# Patient Record
Sex: Male | Born: 2000
Health system: Southern US, Community
[De-identification: ages and names within clinical notes are randomized; demographics above are authoritative.]

## PROBLEM LIST (undated history)

## (undated) DIAGNOSIS — R4586 Emotional lability: Secondary | ICD-10-CM

## (undated) DIAGNOSIS — J45909 Unspecified asthma, uncomplicated: Secondary | ICD-10-CM

## (undated) DIAGNOSIS — D369 Benign neoplasm, unspecified site: Secondary | ICD-10-CM

## (undated) DIAGNOSIS — G40909 Epilepsy, unspecified, not intractable, without status epilepticus: Secondary | ICD-10-CM

## (undated) HISTORY — PX: TONSILLECTOMY AND ADENOIDECTOMY: SHX28

## (undated) HISTORY — DX: Unspecified asthma, uncomplicated: J45.909

## (undated) HISTORY — PX: BRAIN SURGERY: SHX531

---
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
## 2019-03-14 ENCOUNTER — Other Ambulatory Visit: Payer: Self-pay

## 2019-03-14 DIAGNOSIS — Z20822 Contact with and (suspected) exposure to covid-19: Secondary | ICD-10-CM

## 2019-03-16 LAB — NOVEL CORONAVIRUS, NAA: SARS-CoV-2, NAA: NOT DETECTED

## 2019-07-19 ENCOUNTER — Ambulatory Visit: Payer: No Typology Code available for payment source | Attending: Internal Medicine

## 2019-07-19 DIAGNOSIS — Z23 Encounter for immunization: Secondary | ICD-10-CM

## 2019-07-19 NOTE — Progress Notes (Signed)
   Covid-19 Vaccination Clinic  Name:  Connor Woods    MRN: [id number] DOB: 2000-08-16  07/19/2019  Mr. Gatson was observed post Covid-19 immunization for 15 minutes without incident. He was provided with Vaccine Information Sheet and instruction to access the V-Safe system.   Mr. Farner was instructed to call 911 with any severe reactions post vaccine: Marland Kitchen Difficulty breathing  . Swelling of face and throat  . A fast heartbeat  . A bad rash all over body  . Dizziness and weakness   Immunizations Administered    Name Date Dose VIS Date Route   Pfizer COVID-19 Vaccine 07/19/2019  9:52 AM 0.3 mL 04/13/2019 Intramuscular   Manufacturer: [place]   Lot: [id number]   [place]: [id number]

## 2019-08-13 ENCOUNTER — Ambulatory Visit: Payer: No Typology Code available for payment source | Attending: Internal Medicine

## 2019-08-13 DIAGNOSIS — Z23 Encounter for immunization: Secondary | ICD-10-CM

## 2019-08-13 NOTE — Progress Notes (Signed)
   Covid-19 Vaccination Clinic  Name:  Connor Woods    MRN: [id number] DOB: 2000/10/18  08/13/2019  Mr. Connor Woods was observed post Covid-19 immunization for 15 minutes without incident. He was provided with Vaccine Information Sheet and instruction to access the V-Safe system.   Mr. Connor Woods was instructed to call 911 with any severe reactions post vaccine: Marland Kitchen Difficulty breathing  . Swelling of face and throat  . A fast heartbeat  . A bad rash all over body  . Dizziness and weakness   Immunizations Administered    Name Date Dose VIS Date Route   Pfizer COVID-19 Vaccine 08/13/2019  9:50 AM 0.3 mL 04/13/2019 Intramuscular   Manufacturer: [place]   Lot: [id number]   [place]: [id number]

## 2019-10-05 ENCOUNTER — Emergency Department (HOSPITAL_COMMUNITY): Payer: 59

## 2019-10-05 ENCOUNTER — Other Ambulatory Visit: Payer: Self-pay

## 2019-10-05 ENCOUNTER — Encounter (HOSPITAL_COMMUNITY): Payer: Self-pay

## 2019-10-05 ENCOUNTER — Emergency Department (HOSPITAL_COMMUNITY)
Admission: EM | Admit: 2019-10-05 | Discharge: 2019-10-05 | Disposition: A | Discharge status: Home / Self Care | Payer: 59 | Attending: Emergency Medicine | Admitting: Emergency Medicine

## 2019-10-05 DIAGNOSIS — D696 Thrombocytopenia, unspecified: Secondary | ICD-10-CM

## 2019-10-05 DIAGNOSIS — Z20822 Contact with and (suspected) exposure to covid-19: Secondary | ICD-10-CM | POA: Diagnosis not present

## 2019-10-05 DIAGNOSIS — R509 Fever, unspecified: Secondary | ICD-10-CM | POA: Diagnosis present

## 2019-10-05 DIAGNOSIS — Z8669 Personal history of other diseases of the nervous system and sense organs: Secondary | ICD-10-CM | POA: Insufficient documentation

## 2019-10-05 DIAGNOSIS — B349 Viral infection, unspecified: Secondary | ICD-10-CM | POA: Diagnosis not present

## 2019-10-05 DIAGNOSIS — R112 Nausea with vomiting, unspecified: Secondary | ICD-10-CM | POA: Diagnosis not present

## 2019-10-05 DIAGNOSIS — Z79899 Other long term (current) drug therapy: Secondary | ICD-10-CM | POA: Diagnosis not present

## 2019-10-05 DIAGNOSIS — J029 Acute pharyngitis, unspecified: Secondary | ICD-10-CM | POA: Diagnosis not present

## 2019-10-05 LAB — CBC WITH DIFFERENTIAL/PLATELET
Abs Immature Granulocytes: 0.07 10*3/uL (ref 0.00–0.07)
Basophils Absolute: 0 10*3/uL (ref 0.0–0.1)
Basophils Relative: 0 %
Eosinophils Absolute: 0 10*3/uL (ref 0.0–0.5)
Eosinophils Relative: 0 %
HCT: 41.9 % (ref 39.0–52.0)
Hemoglobin: 14.9 g/dL (ref 13.0–17.0)
Immature Granulocytes: 1 %
Lymphocytes Relative: 8 %
Lymphs Abs: 0.6 10*3/uL — ABNORMAL LOW (ref 0.7–4.0)
MCH: 33 pg (ref 26.0–34.0)
MCHC: 35.6 g/dL (ref 30.0–36.0)
MCV: 92.7 fL (ref 80.0–100.0)
Monocytes Absolute: 1.6 10*3/uL — ABNORMAL HIGH (ref 0.1–1.0)
Monocytes Relative: 21 %
Neutro Abs: 5.2 10*3/uL (ref 1.7–7.7)
Neutrophils Relative %: 70 %
Platelets: 67 10*3/uL — ABNORMAL LOW (ref 150–400)
RBC: 4.52 MIL/uL (ref 4.22–5.81)
RDW: 11.5 % (ref 11.5–15.5)
WBC: 7.5 10*3/uL (ref 4.0–10.5)
nRBC: 0 % (ref 0.0–0.2)

## 2019-10-05 LAB — COMPREHENSIVE METABOLIC PANEL
ALT: 18 U/L (ref 0–44)
AST: 24 U/L (ref 15–41)
Albumin: 3.4 g/dL — ABNORMAL LOW (ref 3.5–5.0)
Alkaline Phosphatase: 48 U/L (ref 38–126)
Anion gap: 11 (ref 5–15)
BUN: 15 mg/dL (ref 6–20)
CO2: 20 mmol/L — ABNORMAL LOW (ref 22–32)
Calcium: 9.4 mg/dL (ref 8.9–10.3)
Chloride: 108 mmol/L (ref 98–111)
Creatinine, Ser: 1.19 mg/dL (ref 0.61–1.24)
GFR calc Af Amer: >60 mL/min (ref >60)
GFR calc non Af Amer: >60 mL/min (ref >60)
Glucose, Bld: 106 mg/dL — ABNORMAL HIGH (ref 70–99)
Potassium: 3.4 mmol/L — ABNORMAL LOW (ref 3.5–5.1)
Sodium: 139 mmol/L (ref 135–145)
Total Bilirubin: 1.4 mg/dL — ABNORMAL HIGH (ref 0.3–1.2)
Total Protein: 6.2 g/dL — ABNORMAL LOW (ref 6.5–8.1)

## 2019-10-05 LAB — SARS CORONAVIRUS 2 BY RT PCR (HOSPITAL ORDER, PERFORMED IN ~~LOC~~ HOSPITAL LAB): SARS Coronavirus 2: NEGATIVE

## 2019-10-05 MED ORDER — ONDANSETRON HCL 4 MG/2ML IJ SOLN
4.0000 mg | Freq: Once | INTRAMUSCULAR | Status: AC
  Administered 2019-10-05: 4 mg via INTRAVENOUS
  Filled 2019-10-05: qty 2

## 2019-10-05 MED ORDER — ONDANSETRON HCL 4 MG PO TABS: 4.0000 mg | ORAL_TABLET | Freq: Three times a day (TID) | ORAL | 0 refills | Status: DC | PRN

## 2019-10-05 MED ORDER — SODIUM CHLORIDE 0.9 % IV BOLUS
1000.0000 mL | Freq: Once | INTRAVENOUS | Status: AC
  Administered 2019-10-05: 1000 mL via INTRAVENOUS

## 2019-10-05 NOTE — ED Triage Notes (Signed)
Pt presents w/non productive cough, fever, N/V and feeling like crap x1 week. Pt seen at [facility] yesterday prescribed a cough syrup and amoxicillin w/no relief

## 2019-10-05 NOTE — Discharge Instructions (Addendum)
You were seen in the emergency department for continued fever cough sore throat body aches nausea vomiting diarrhea.  You had blood work that showed your platelets were low.  This possibly is a response to your illness.  You will need to have repeat blood work done in a week to make sure that the blood count is stabilizing.  Your chest x-ray and Covid testing were unremarkable.  Please drink plenty of fluids.  We are prescribing some nausea medication to help with your symptoms.  Return to the emergency department if any worsening or concerning symptoms

## 2019-10-05 NOTE — ED Notes (Signed)
Pt discharge instructions and prescription reviewed with the patient. The patient verbalized understanding of instructions. Pt discharged.

## 2019-10-05 NOTE — ED Provider Notes (Signed)
[facility] EMERGENCY DEPARTMENT Provider Note   CSN: [id number] Arrival date & time: 10/05/19  0801     History Chief Complaint  Patient presents with  . Fever  . Cough    JAVID KEMLER is a 19 y.o. male.  He has a history of epilepsy.  Complaining of 1 week of fevers cough nonproductive, sore throat, nausea and vomiting.  Girlfriend had a cold.  Went to urgent care yesterday, strep and mono negative.  Treated with cough syrup and amoxicillin.  No relief.  The history is provided by the patient.  URI Presenting symptoms: cough, fever and sore throat   Severity:  Moderate Onset quality:  Gradual Duration:  1 week Timing:  Constant Progression:  Unchanged Chronicity:  New Relieved by:  Nothing Worsened by:  Nothing Ineffective treatments:  OTC medications and prescription medications Associated symptoms: myalgias   Associated symptoms: no arthralgias, no headaches and no wheezing   Risk factors: sick contacts   Risk factors: no immunosuppression        Past Medical History:  Diagnosis Date  . 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 Epilepsy ([place])    frontal lobe - last seizure 08/20/2013  . Mood swings     Patient Active Problem List   Diagnosis Date Noted  . 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 Psychological factors affecting medical condition 03/19/2013  . 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 CLOSED REDUCTION ELBOW FRACTURE Left 02/14/2010   ulnar fx. and radial head dislocation  . TONSILLECTOMY AND ADENOIDECTOMY    . UMBILICAL HERNIA REPAIR  12/26/2008       No family history on file.  Social History   Tobacco Use  . Smoking status: Never Smoker  . Smokeless tobacco: Never Used  Substance Use Topics  . Alcohol use: No  . Drug use: No    Home Medications Prior to Admission medications   Medication Sig Start Date End Date Taking? Authorizing  Provider  clobazam (ONFI) 10 MG tablet Take 20 mg by mouth every evening.  03/23/13   Winson, Manus Rudd, NP  divalproex (DEPAKOTE ER) 250 MG 24 hr tablet Take 250 mg by mouth every evening. 08/22/13   [provider]  divalproex (DEPAKOTE ER) 500 MG 24 hr tablet Take 500 mg by mouth 2 (two) times daily. 08/22/13   [provider]  guanFACINE (TENEX) 1 MG tablet Take 1 mg by mouth at bedtime.    [provider]  MIDAZOLAM HCL PO Place 1 mL into both nostrils See admin instructions. 1 ml in each nostril for seizure >53min 08/17/11   [provider]  Oxcarbazepine (TRILEPTAL) 300 MG tablet Take 450-600 mg by mouth 2 (two) times daily. Takes 450 mg AM and 600 mg PM    [provider]    Allergies    Patient has no known allergies.  Review of Systems   Review of Systems  Constitutional: Positive for fever.  HENT: Positive for sore throat.   Eyes: Negative for visual disturbance.  Respiratory: Positive for cough. Negative for wheezing.   Cardiovascular: Negative for chest pain.  Gastrointestinal: Positive for nausea and vomiting. Negative for diarrhea.  Genitourinary: Negative for dysuria.  Musculoskeletal: Positive for myalgias. Negative for arthralgias.  Skin: Negative for rash.  Neurological: Negative for headaches.    Physical Exam Updated Vital Signs BP (!) 136/102   Pulse Marland Kitchen)  124   Temp 99.2 F (37.3 C) (Oral)   Resp 18   Ht 6' (1.829 m)   Wt 71.7 kg   SpO2 95%   BMI 21.43 kg/m   Physical Exam Vitals and nursing note reviewed.  Constitutional:      Appearance: He is well-developed.  HENT:     Head: Normocephalic and atraumatic.     Right Ear: Tympanic membrane normal.     Left Ear: Tympanic membrane normal.     Nose: Nose normal.     Mouth/Throat:     Mouth: Mucous membranes are moist.     Pharynx: Oropharynx is clear. Posterior oropharyngeal erythema present. No oropharyngeal exudate.  Eyes:     Conjunctiva/sclera: Conjunctivae  normal.  Cardiovascular:     Rate and Rhythm: Regular rhythm. Tachycardia present.     Heart sounds: No murmur.  Pulmonary:     Effort: Pulmonary effort is normal. No respiratory distress.     Breath sounds: Normal breath sounds.  Abdominal:     Palpations: Abdomen is soft.     Tenderness: There is no abdominal tenderness.  Musculoskeletal:        General: No deformity or signs of injury. Normal range of motion.     Cervical back: Neck supple. No tenderness.  Skin:    General: Skin is warm and dry.     Capillary Refill: Capillary refill takes less than 2 seconds.  Neurological:     General: No focal deficit present.     Mental Status: He is alert.     ED Results / Procedures / Treatments   Labs (all labs ordered are listed, but only abnormal results are displayed) Labs Reviewed  CBC WITH DIFFERENTIAL/PLATELET - Abnormal; Notable for the following components:      Result Value   Platelets 67 (*)    Lymphs Abs 0.6 (*)    Monocytes Absolute 1.6 (*)    All other components within normal limits  COMPREHENSIVE METABOLIC PANEL - Abnormal; Notable for the following components:   Potassium 3.4 (*)    CO2 20 (*)    Glucose, Bld 106 (*)    Total Protein 6.2 (*)    Albumin 3.4 (*)    Total Bilirubin 1.4 (*)    All other components within normal limits  SARS CORONAVIRUS 2 BY RT PCR (HOSPITAL ORDER, [place] [facility])    EKG None  Radiology DG Chest Port 1 View  Result Date: 10/05/2019 CLINICAL DATA:  Cough with fever. EXAM: PORTABLE CHEST 1 VIEW COMPARISON:  09/16/2011 FINDINGS: 0902 hours. Rightward patient rotation. The lungs are clear without focal pneumonia, edema, pneumothorax or pleural effusion. The cardiopericardial silhouette is within normal limits for size. The visualized bony structures of the thorax are intact. IMPRESSION: No active disease. Electronically Signed   By: Misty Stanley M.D.   On: 10/05/2019 09:28    Procedures Procedures (including  critical care time)  Medications Ordered in ED Medications  sodium chloride 0.9 % bolus 1,000 mL (0 mLs Intravenous Stopped 10/05/19 1301)  ondansetron (ZOFRAN) injection 4 mg (4 mg Intravenous Given 10/05/19 0919)    ED Course  I have reviewed the triage vital signs and the nursing notes.  Pertinent labs & imaging results that were available during my care of the patient were reviewed by me and considered in my medical decision making (see chart for details).  Clinical Course as of Oct 04 1925  Fri Oct 05, 2019  0909 Chest x-ray  interpreted by me as no gross infiltrates.  Awaiting radiology reading.   [MB]  1217 Reevaluated patient, tachycardia improved.  He said he is was able to sleep for an hour and feels much better.  Lab work showing normal CBC and hemoglobin but low platelets, change from prior.  Chemistries fairly nonspecific and likely due to some element of dehydration.  Covid testing negative.   [MB]  2563 Discussed with Dr. Irene Limbo hematology who said this is likely due to to suppression from a viral illness.  If he has no objective bleeding and otherwise appears well he can be discharged and would need a repeat blood count within a week.  If his platelets continue to drop would recommend following up with hematology and if he has any bleeding he should return to the emergency department.   [MB]    Clinical Course User Index [MB] Hayden Rasmussen, MD   MDM Rules/Calculators/A&P                     This patient complains of fevers cough nausea vomiting sore throat; this involves an extensive number of treatment Options and is a complaint that carries with it a high risk of complications and Morbidity. The differential includes viral syndrome, Covid, pneumonia, strep throat, mono  I ordered, reviewed and interpreted labs, which included CBC which shows normal white count normal hemoglobin, low potassium changed from prior 6 years ago.  Chemistries fairly nonspecific with a mildly  low potassium bicarb and slightly elevated glucose.  Probably reflecting some element of dehydration.  Covid testing negative. I ordered medication IV fluids and nausea medication with improvement in his symptoms. I ordered imaging studies which included chest x-ray and I independently    visualized and interpreted imaging which showed no gross infiltrates Additional history obtained from patient's mother Previous records obtained and reviewed in epic I consulted Dr Irene Limbo hematology and discussed lab and imaging findings  Critical Interventions: None  After the interventions stated above, I reevaluated the patient and found patient symptoms to be improved.  I reviewed his lab work-up with him and with imaging.  Also reviewed hematology recommendations.  He is comfortable with plan.  Return instructions discussed.  ALVEY BROCKEL was evaluated in Emergency Department on 10/05/2019 for the symptoms described in the history of present illness. He was evaluated in the context of the global COVID-19 pandemic, which necessitated consideration that the patient might be at risk for infection with the SARS-CoV-2 virus that causes COVID-19. Institutional protocols and algorithms that pertain to the evaluation of patients at risk for COVID-19 are in a state of rapid change based on information released by regulatory bodies including the CDC and federal and state organizations. These policies and algorithms were followed during the patient's care in the ED.  Final Clinical Impression(s) / ED Diagnoses Final diagnoses:  Viral illness  Thrombocytopenia ([place])    Rx / DC Orders ED Discharge Orders         Ordered    ondansetron (ZOFRAN) 4 MG tablet  Every 8 hours PRN     10/05/19 1246           Hayden Rasmussen, MD 10/05/19 1930

## 2019-10-24 ENCOUNTER — Ambulatory Visit (INDEPENDENT_AMBULATORY_CARE_PROVIDER_SITE_OTHER): Payer: 59 | Admitting: Neurology

## 2019-10-24 ENCOUNTER — Encounter: Payer: Self-pay | Admitting: Neurology

## 2019-10-24 VITALS — BP 118/76 | HR 102 | Ht 73.0 in | Wt 154.0 lb

## 2019-10-24 DIAGNOSIS — G40802 Other epilepsy, not intractable, without status epilepticus: Secondary | ICD-10-CM | POA: Diagnosis not present

## 2019-10-24 DIAGNOSIS — F54 Psychological and behavioral factors associated with disorders or diseases classified elsewhere: Secondary | ICD-10-CM | POA: Diagnosis not present

## 2019-10-24 MED ORDER — DIVALPROEX SODIUM ER 500 MG PO TB24: 1000.0000 mg | ORAL_TABLET | Freq: Every evening | ORAL | 1 refills | Status: DC

## 2019-10-24 MED ORDER — DIVALPROEX SODIUM ER 250 MG PO TB24: 250.0000 mg | ORAL_TABLET | Freq: Every day | ORAL | 1 refills | Status: DC

## 2019-10-24 MED ORDER — CLOBAZAM 20 MG PO TABS: 20.0000 mg | ORAL_TABLET | Freq: Every evening | ORAL | 3 refills | Status: DC

## 2019-10-24 MED ORDER — OXCARBAZEPINE 600 MG PO TABS: 900.0000 mg | ORAL_TABLET | Freq: Every day | ORAL | 1 refills | Status: DC

## 2019-10-24 MED ORDER — SERTRALINE HCL 100 MG PO TABS: 100.0000 mg | ORAL_TABLET | Freq: Every day | ORAL | 1 refills | Status: DC

## 2019-10-24 NOTE — Patient Instructions (Signed)
Epilepsy Epilepsy is when a person keeps having seizures. A seizure is a burst of abnormal activity in the brain. A seizure can change how you think or behave, and it can make it hard to be aware of what is happening. This condition can cause problems such as:  Falls, accidents, and injury.  Sadness (depression).  Poor memory.  Sudden unexplained death in epilepsy (SUDEP). This is rare. Its cause is not known. Most people with epilepsy lead normal lives. What are the causes? This condition may be caused by:  A head injury.  An injury that happens at birth.  A high fever during childhood.  A stroke.  Bleeding that goes into or around the brain.  Certain medicines and drugs.  Having too little oxygen for a long period of time.  Abnormal brain development.  Certain infections.  Brain tumors.  Conditions that are passed from parent to child (are hereditary). What are the signs or symptoms? Symptoms of a seizure vary from person to person. They may include:  Jerky movements of muscles (convulsions).  Stiffening of the body.  Movements of the arms or legs that you are not able to control.  Passing out (loss of consciousness).  Breathing problems.  Sudden falls.  Confusion.  Head nodding.  Eye blinking or twitching.  Lip smacking.  Drooling.  Fast eye movements.  Grunting.  Not being able to control when you pee or poop.  Staring.  Being hard to wake up (unresponsiveness). Some people have symptoms right before a seizure happens (aura) and right after a seizure happens. These symptoms include:  Fear or anxiety.  Feeling sick to your stomach (nauseous).  Feeling like the room is spinning (vertigo).  A feeling of having seen or heard something before (dj vu).  Odd tastes or smells.  Changes in how you see (vision), such as seeing flashing lights or spots. Symptoms that follow a seizure include:  Being confused.  Being sleepy.  Having a  headache. How is this treated? Treatment can control seizures. Treatment for this condition may involve:  Taking medicines to control seizures.  Having a device (vagus nerve stimulator) put in the chest. The device sends signals to a nerve and to the brain to prevent seizures.  Brain surgery to stop seizures from happening or to reduce how often they happen.  Having blood tests often to make sure you are getting the right amount of medicine. Once this condition has been diagnosed, it is important to start treatment as soon as possible. For some people, epilepsy goes away in time. Others will need treatment for the rest of their life. Follow these instructions at home: Medicines  Take over-the-counter and prescription medicines only as told by your doctor.  Avoid anything that may keep your medicine from working, such as alcohol. Activity  Get enough rest. Lack of sleep can make seizures more likely to occur.  Follow your doctor's advice about driving, swimming, and doing anything else that would be dangerous if you had a seizure. ? If you live in the [place] check with your local DMV (department of motor vehicles) to find out about local driving laws. Each state has rules about when you can return to driving. Teaching others Teach friends and family what to do if you have a seizure. They should:  Lay you on the ground to prevent a fall.  Cushion your head and body.  Loosen any tight clothing around your neck.  Turn you on your side.  Stay with   you until you are better.  Not hold you down.  Not put anything in your mouth.  Know whether or not you need emergency care.  General instructions  Avoid anything that causes you to have seizures.  Keep a seizure diary. Write down what you remember about each seizure. Be sure to include what might have caused it.  Keep all follow-up visits as told by your doctor. This is important. Contact a doctor if:  You have a change in how  often or when you have seizures.  You get an infection or start to feel sick. You may have more seizures when you are sick. Get help right away if:  A seizure does not stop after 5 minutes.  You have more than one seizure in a row, and you do not have enough time between the seizures to feel better.  A seizure makes it harder to breathe.  A seizure is different from other seizures you have had.  A seizure makes you unable to speak or use a part of your body.  You did not wake up right after a seizure. These symptoms may be an emergency. Do not wait to see if the symptoms will go away. Get medical help right away. Call your local emergency services (911 in the [place] Do not drive yourself to the hospital. Summary  Epilepsy is when a person keeps having seizures. A seizure is a burst of abnormal activity in the brain.  Treatment can control seizures.  Teach friends and family what to do if you have a seizure. This information is not intended to replace advice given to you by your health care provider. Make sure you discuss any questions you have with your health care provider. Document Revised: 12/12/2017 Document Reviewed: 12/12/2017 Elsevier Patient Education  2020 Elsevier Inc.  

## 2019-10-24 NOTE — Progress Notes (Signed)
Provider:  Larey Seat, M D  Referring Provider: Monna Fam, MD Primary Care Physician:  Deland Pretty, MD  Chief Complaint  Patient presents with  . New Patient (Initial Visit)    pt with mom, rm 10. pt has had epilepsy and brain surgery 5 yrs ago. Here to establish to care with neurology for seizure management.     HPI:  Connor Woods is a 19 y.o. male patient is seen here on 10-24-2019, as a referral  from Dr. Shelia Media for transfer of epilepsy care. Connor Woods was raised by his Adonis Brook. He was born at 48 weeks , 3  Pounds 5 ounces.  Seizures declared at age 89, [place] mal epilepsy.  Connor Woods underwent Epilepsy surgery at [facility] in 8/ 2016, and has been seizure free since.  He had intracranial monitoring- left temporal focus was identified.   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 5 years ago in August he underwent intracranial electrode placement and within 24 hours a seizure focus was identified within a week he underwent a right-sided temporal lobectomy.  He has since then denied to have any seizure activity, none has been witnessed, he does not have any perceived deficits in his medication regimen has been unchanged since March 2019 and it was last refilled through the [facility] offices.  Under the current guidelines I would not change his medication even if medication has led to some thrombocytopenia.  He is taking valproic acid, clobazam, oxcarbazepine.  He has had extensive blood work with Dr. Shelia Media   Social history: [name], [place], and [place], [place]. Non smoker and non drinker. 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     Review of Systems: Out of a complete 14 system review, the patient complains of only the following symptoms, and all other  reviewed systems are negative.   Just needs refills now.   Social History   Socioeconomic History  . Marital status: Single    Spouse name: Not on file  . Number of children: Not on file  . Years of education: Not on file  . Highest education level: Not on file  Occupational History  . Not on file  Tobacco Use  . Smoking status: Never Smoker  . Smokeless tobacco: Never Used  Substance and Sexual Activity  . Alcohol use: No  . Drug use: No  . Sexual activity: Never  Other Topics Concern  . Not on file  Social History Narrative  . Not on file   Social Determinants of Health   Financial Resource Strain:   . Difficulty of Paying Living Expenses:   Food Insecurity:   . Worried About Charity fundraiser in the Last Year:   . Arboriculturist in the Last Year:   Transportation Needs:   . Film/video editor (Medical):   Marland Kitchen Lack of Transportation (Non-Medical):   Physical Activity:   . Days of Exercise per Week:   . Minutes of Exercise per Session:   Stress:   . Feeling of Stress :   Social Connections:   . Frequency of Communication with Friends and Family:   . Frequency of Social Gatherings with Friends and Family:   . Attends Religious Services:   . Active Member of Clubs or Organizations:   . Attends Archivist Meetings:   .  Marital Status:   Intimate Partner Violence:   . Fear of Current or Ex-Partner:   . Emotionally Abused:   Marland Kitchen Physically Abused:   . Sexually Abused:     No family history on file.  Past Medical History:  Diagnosis Date  . 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 Mood swings     Past Surgical History:  Procedure Laterality Date  . CLOSED REDUCTION ELBOW FRACTURE Left 02/14/2010   ulnar fx. and radial head dislocation  . TONSILLECTOMY AND ADENOIDECTOMY    . UMBILICAL HERNIA REPAIR  12/26/2008    Current Outpatient Medications  Medication Sig Dispense Refill   . cloBAZam (ONFI) 20 MG tablet Take 20 mg by mouth every evening.     . divalproex (DEPAKOTE ER) 250 MG 24 hr tablet Take 250 mg by mouth every evening.    . divalproex (DEPAKOTE ER) 500 MG 24 hr tablet Take 1,000 mg by mouth every evening.     Marland Kitchen oxcarbazepine (TRILEPTAL) 600 MG tablet Take 900 mg by mouth daily.      No current facility-administered medications for this visit.    Allergies as of 10/24/2019  . (No Known Allergies)    Vitals: There were no vitals taken for this visit. Last Weight:  Wt Readings from Last 1 Encounters:  10/05/19 158 lb (71.7 kg) (55 %, Z= 0.13)*   * Growth percentiles are based on CDC (Boys, 2-20 Years) data.   Last Height:   Ht Readings from Last 1 Encounters:  10/05/19 6' (1.829 m) (80 %, Z= 0.86)*   * Growth percentiles are based on CDC (Boys, 2-20 Years) data.    BMI ?  Physical exam:  General: The patient is awake, alert and appears not in acute distress. The patient is well groomed. Head: Normocephalic, atraumatic. Neck is supple.   Cardiovascular:  Regular rate and rhythm, without  murmurs or carotid bruit, and without distended neck veins. Respiratory: Lungs are clear to auscultation. Skin:  Without evidence of edema, or rash Trunk: BMI is  elevated and patient  has normal posture.  Neurologic exam : The patient is awake and alert, oriented to place and time.  Memory subjective described as intact. There is a normal attention span & concentration ability. Speech is fluent without dysarthria, dysphonia or aphasia. Mood and affect are appropriate.  Cranial nerves: Pupils are equal and briskly reactive to light. Funduscopic exam without evidence of pallor or edema. Extraocular movements  in vertical and horizontal planes  With an left to right nystagmus. Ptosis. Large pupils.  Visual fields by finger perimetry are intact. Hearing to finger rub intact.   Facial sensation intact to fine touch. Facial motor strength is symmetric and  tongue and uvula move midline.  Tongue protrusion into either cheek is normal. Shoulder shrug is normal.  Motor exam:  Normal tone ,muscle bulk and symmetric  strength in all extremities. Sensory:  Fine touch, pinprick and vibration were tested in all extremities.  Proprioception was normal. Coordination: Rapid alternating movements in the fingers/hands were normal. Finger-to-nose maneuver without evidence of ataxia, dysmetria or tremor. Gait and station: Patient walks without assistive device and is able unassisted to climb up to the exam table. Strength within normal limits. Stance is stable and normal. Tandem gait is unfragmented.  Deep tendon reflexes: in the  upper and lower extremities are symmetric and intact. Babinski maneuver response is  downgoing.  I was able to review Dr. Pennie Banter recent lab results for the patient on the smart phone.  White blood cell count was 8.8K which is excellent RBC 4.39, hemoglobin 14.3, hematocrit 40.7% platelets 176,000 lymphocytes 28 neutrophils 55 eosinophils 1.3 basophil 0.2M0 immature 15% there is no thrombocytopenia at this level.  Metabolic panel was entirely normal except for a lower slightly low albumin and protein total.  Liver and kidney function intact, creatinine was 0.84 AST 22 ALT 21 lipid panel showed low total cholesterol but high triglycerides which may be more diet induced.  Urine analysis pH is 7.5 no proteinuria no ketones.  Assessment:  After physical and neurologic examination, review of laboratory studies, imaging, neurophysiology testing and pre-existing records, assessment is that of :   Excellent seizure control on current meds, needs refill.   Plan:  Treatment plan and additional workup :  I will refill the medication.  I requested the medical records.    Asencion Partridge Dex Blakely MD 10/24/2019

## 2019-11-08 ENCOUNTER — Telehealth: Payer: Self-pay | Admitting: Neurology

## 2019-11-08 NOTE — Telephone Encounter (Signed)
Called the patient's caregiver and advised that from what I can tell this can be gotten off of [email or web address]. the cheapest I was able to find the medication was [facility] for the 20 mg 90 tablet generic brand-56$. Advised an alternative would be going to onfi.com in which she can call their patient support number to find what would be the better savings route through the website. She was very appreciative for the call and had no questions Pt will reach out in 2 mths to advise a refill will need to be sent and at that point we can reassess who has better price and send to that pharmacy using [facility].

## 2019-11-08 NOTE — Telephone Encounter (Signed)
Pt's mother ([place], [place]) has called asking for a prescription card for pt's cloBAZam (ONFI) 20 MG tablet.  She states she just paid $300.00 for a 3 month supply.  Please call

## 2020-04-14 ENCOUNTER — Encounter: Payer: Self-pay | Admitting: Neurology

## 2020-04-14 ENCOUNTER — Ambulatory Visit (INDEPENDENT_AMBULATORY_CARE_PROVIDER_SITE_OTHER): Payer: 59 | Admitting: Neurology

## 2020-04-14 ENCOUNTER — Other Ambulatory Visit: Payer: Self-pay

## 2020-04-14 VITALS — BP 109/68 | HR 86 | Ht 73.0 in | Wt 140.0 lb

## 2020-04-14 DIAGNOSIS — F54 Psychological and behavioral factors associated with disorders or diseases classified elsewhere: Secondary | ICD-10-CM

## 2020-04-14 DIAGNOSIS — F0630 Mood disorder due to known physiological condition, unspecified: Secondary | ICD-10-CM | POA: Diagnosis not present

## 2020-04-14 DIAGNOSIS — G40802 Other epilepsy, not intractable, without status epilepticus: Secondary | ICD-10-CM | POA: Diagnosis not present

## 2020-04-14 MED ORDER — DIVALPROEX SODIUM ER 250 MG PO TB24: 250.0000 mg | ORAL_TABLET | Freq: Every evening | ORAL | 1 refills | Status: DC

## 2020-04-14 MED ORDER — DIVALPROEX SODIUM ER 500 MG PO TB24: 1000.0000 mg | ORAL_TABLET | Freq: Every evening | ORAL | 1 refills | Status: DC

## 2020-04-14 MED ORDER — OXCARBAZEPINE 600 MG PO TABS: 900.0000 mg | ORAL_TABLET | Freq: Every day | ORAL | 1 refills | Status: DC

## 2020-04-14 MED ORDER — CLOBAZAM 20 MG PO TABS: 20.0000 mg | ORAL_TABLET | Freq: Every evening | ORAL | 3 refills | Status: DC

## 2020-04-14 NOTE — Progress Notes (Signed)
Provider:  Larey Seat, M D  Referring Provider: Deland Pretty, MD Primary Care Physician:  Deland Pretty, MD  Chief Complaint  Patient presents with  . Follow-up    Pt alone, rm 11. Presents for 6 month f/u. Denies any sz like activity. States overall no issues or concerns.     HPI:  Connor Woods is a 19 y.o. 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  No further seizure activity since 6 month ago- time for a refill of depakote, clobazam and oxcarbazepine.  Needs some baseline labs today, but reports that Dr. Shelia Media has just done that - I have no access by [facility] and he didn't bring his smart phone or print out. - CMET, CBC and LFT. Is now fully vaccinated for Covid 19.        10-24-2019, as a referral  from Dr. Shelia Media for transfer of epilepsy care. Connor Woods was raised by his Adonis Brook. He was born at 33 weeks , 3  Pounds 5 ounces.  Seizures declared at age 56, [place] mal epilepsy.  Connor Woods underwent Epilepsy surgery at [facility] in 8/ 2016, and has been seizure free since.  He had intracranial monitoring- left temporal focus was identified.   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 5 years ago in August he underwent intracranial electrode placement and within 24 hours a seizure focus was identified within a week he underwent a right-sided temporal lobectomy.  He has since then denied to have any seizure activity, none has been witnessed, he does not have any perceived deficits in his medication regimen has been unchanged since March 2019 and it was last refilled through the [facility] offices.  Under the current guidelines I would not change his medication even if medication has led to some thrombocytopenia.  He is taking valproic acid, clobazam,  oxcarbazepine.  He has had extensive blood work with Dr. Shelia Media  Social history: [name], [place], and [place], [place]. Non smoker and non drinker. 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     Review of Systems: Out of a complete 14 system review, the patient complains of only the following symptoms, and all other reviewed systems are negative.   Just needs refills now.    Mood reportedly stabile and no acute depression. PHQ2 - negative.   Social History   Socioeconomic History  . Marital status: Single    Spouse name: Not on file  . Number of children: Not on file  . Years of education: Not on file  . Highest education level: Not on file  Occupational History  . Not on file  Tobacco Use  . Smoking status: Never Smoker  . Smokeless tobacco: Never Used  Vaping Use  . Vaping Use: Every day  Substance and Sexual Activity  . Alcohol use: No  . Drug use: No  . Sexual activity: Never  Other Topics Concern  . Not on file  Social History Narrative  . Not on file   Social Determinants of Health   Financial Resource Strain: Not on file  Food Insecurity: Not on file  Transportation Needs: Not on file  Physical Activity: Not on file  Stress: Not on file  Social Connections: Not on file  Intimate Partner Violence: Not on file  Family History  Problem Relation Age of Onset  . Kidney disease Paternal Aunt   . Cancer Paternal Uncle   . Lung cancer Maternal Grandfather   . Hypertension Paternal Grandmother     Past Medical History:  Diagnosis Date  . Asthma   . 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 Mood swings     Past Surgical History:  Procedure Laterality Date  . BRAIN SURGERY    . CLOSED REDUCTION ELBOW FRACTURE Left 02/14/2010   ulnar fx. and radial head dislocation  . TONSILLECTOMY AND ADENOIDECTOMY    . UMBILICAL HERNIA REPAIR  12/26/2008    Current  Outpatient Medications  Medication Sig Dispense Refill  . cloBAZam (ONFI) 20 MG tablet Take 1 tablet (20 mg total) by mouth every evening. 90 tablet 3  . divalproex (DEPAKOTE ER) 250 MG 24 hr tablet Take 250 mg by mouth every evening.    . divalproex (DEPAKOTE ER) 250 MG 24 hr tablet Take 1 tablet (250 mg total) by mouth daily. 90 tablet 1  . divalproex (DEPAKOTE ER) 500 MG 24 hr tablet Take 2 tablets (1,000 mg total) by mouth every evening. 180 tablet 1  . oxcarbazepine (TRILEPTAL) 600 MG tablet Take 1.5 tablets (900 mg total) by mouth daily. 135 tablet 1  . sertraline (ZOLOFT) 100 MG tablet Take 1 tablet (100 mg total) by mouth daily. 90 tablet 1   No current facility-administered medications for this visit.    Allergies as of 04/14/2020  . (No Known Allergies)    Vitals: BP 109/68   Pulse 86   Ht 6\' 1"  (1.854 m)   Wt 140 lb (63.5 kg)   BMI 18.47 kg/m  Last Weight:  Wt Readings from Last 1 Encounters:  04/14/20 140 lb (63.5 kg)   Last Height:   Ht Readings from Last 1 Encounters:  04/14/20 6\' 1"  (1.854 m)    BMI ?  Physical exam:  General: The patient is awake, alert and appears not in acute distress. The patient is well groomed. Head: Normocephalic, atraumatic. Neck is supple.   Cardiovascular:  Regular rate and rhythm, without  murmurs or carotid bruit, and without distended neck veins. Respiratory: Lungs are clear to auscultation. Skin:  Without evidence of edema, or rash Trunk: BMI is  elevated and patient  has normal posture.  Neurologic exam : The patient is awake and alert, oriented to place and time.  Memory subjective described as intact. There is a normal attention span & concentration ability. Speech is fluent without dysarthria, dysphonia or aphasia. Mood and affect are appropriate.  Cranial nerves: Pupils are equal and briskly reactive to light. Funduscopic exam without evidence of pallor or edema. Extraocular movements  in vertical and horizontal planes   With an left to right nystagmus.  Ptosis. Large pupils but round and reactive , consensual .  Visual fields by finger perimetry are intact. Hearing to finger rub intact.   Facial sensation intact to fine touch. Facial motor strength is symmetric and tongue and uvula move midline.  Tongue protrusion into either cheek is normal. Shoulder shrug is normal.  Motor exam:  Normal tone ,muscle bulk and symmetric  strength in all extremities. Sensory:  Fine touch, pinprick and vibration were tested in all extremities.  Proprioception was normal. Coordination: Rapid alternating movements in the fingers/hands were normal. Finger-to-nose maneuver without evidence of ataxia, dysmetria or tremor. Gait and station:  Patient walks without assistive device and is able unassisted to climb up to the exam table. Strength within normal limits. Stance is stable and normal. Tandem gait is unfragmented.  Deep tendon reflexes: in the  upper and lower extremities are symmetric and intact. Babinski maneuver response is  downgoing.   I was able to review Dr. Pennie Banter recent lab results for the patient on the smart phone in June 2021.  White blood cell count was 8.8K which is excellent RBC 4.39, hemoglobin 14.3, hematocrit 40.7% platelets 176,000 lymphocytes 28 neutrophils 55 eosinophils 1.3 basophil 0.2M0 immature 15% there is no thrombocytopenia at this level.   Metabolic panel was entirely normal except for a lower slightly low albumin and protein total.  Liver and kidney function intact, creatinine was 0.84 AST 22 ALT 21 lipid panel showed low total cholesterol but high triglycerides which may be more diet induced.  Urine analysis pH is 7.5 no proteinuria no ketones.  Assessment:  After physical and neurologic examination, review of laboratory studies, imaging, neurophysiology testing and pre-existing records, assessment is that of :   Excellent seizure control on current meds, needs refill.   Plan:  Treatment plan and  additional workup :  I will refill the medication.  I requested the medical records.    Connor Partridge Maclovio Henson MD 04/14/2020

## 2020-04-14 NOTE — Patient Instructions (Signed)
Dysphoria Dysphoria is an emotion in which a person feels unpleasant or uncomfortable. It may involve mood changes and feelings of sadness, anxiety, irritability, and restlessness. Dysphoria is often caused by normal life stress and usually goes away within several days. Dysphoria that lasts longer than several days may be a symptom of a mental illness, such as major depression or bipolar disorder. Follow these instructions at home:     Monitor your mood for any changes. Take these steps to help with discomfort and unpleasant feelings: Lifestyle  Maintain a healthy lifestyle. ? Eat a healthy diet that includes mood boosters such as nuts, fatty fish, fruits, and vegetables. ? Exercise regularly. Aim for about 20 minutes a day of physical activity. ? Get enough sleep. Try to sleep at least 8 hours every night.  Avoid alcohol and drugs.  Learn ways to reduce stress and cope with stress, such as with yoga and meditation.  Make time to do things that you enjoy.  Do not use any products that contain nicotine or tobacco, such as cigarettes and e-cigarettes. If you need help quitting, ask your health care provider. General instructions  Take over-the-counter and prescription medicines only as told by your health care provider.  Check with your health care provider before taking any herbs or supplements.  Talk about your feelings with family members or health care providers.  Keep all follow-up visits as told by your health care provider.This is important. Contact a health care provider if you:  Were given medicine and it does not seem to be helping.  Feel hopeless and overwhelmed.  Feel like you cannot leave your house.  Have trouble taking care of yourself. Get help right away if you:  Have serious thoughts about hurting yourself or others. If you ever feel like you may hurt yourself or others, or have thoughts about taking your own life, get help right away. You can go to your  nearest emergency department or call:  Your local emergency services (911 in the [place]  A suicide crisis helpline, such as the [facility] at 3640113623. This is open 24 hours a day. Summary  Dysphoria is an emotion in which a person feels unpleasant or uncomfortable. It may involve mood changes and feelings of sadness, anxiety, irritability, and restlessness.  Dysphoria that lasts longer than several days may be a symptom of a mental illness, such as major depression or bipolar disorder.  Take steps to boost your mood by eating well and getting enough sleep and exercise. Be sure to see a health care provider if your symptoms worsen or do not go away with treatment. This information is not intended to replace advice given to you by your health care provider. Make sure you discuss any questions you have with your health care provider. Document Revised: 04/01/2017 Document Reviewed: 03/31/2017 Elsevier Patient Education  2020 [employer].

## 2020-04-17 ENCOUNTER — Other Ambulatory Visit: Payer: Self-pay | Admitting: Neurology

## 2020-04-18 ENCOUNTER — Other Ambulatory Visit: Payer: Self-pay | Admitting: Neurology

## 2020-05-17 ENCOUNTER — Other Ambulatory Visit: Payer: Self-pay | Admitting: Neurology

## 2020-10-09 ENCOUNTER — Other Ambulatory Visit: Payer: Self-pay | Admitting: Neurology

## 2020-10-10 ENCOUNTER — Other Ambulatory Visit: Payer: Self-pay | Admitting: Neurology

## 2020-12-18 ENCOUNTER — Other Ambulatory Visit: Payer: Self-pay | Admitting: Neurology

## 2020-12-23 ENCOUNTER — Telehealth: Payer: Self-pay | Admitting: Neurology

## 2020-12-23 ENCOUNTER — Other Ambulatory Visit: Payer: Self-pay | Admitting: Neurology

## 2020-12-23 NOTE — Telephone Encounter (Signed)
I called pt. He has not changed insurance. Still has [facility]. He provided me info from last years card. Mother, Montez Hageman got on the phone and states he did get new card. They will log into his health insurance portal to get new info and call back with this.

## 2020-12-23 NOTE — Telephone Encounter (Signed)
Called [facility] because PA they started on CMM via nctracks/medicaid. They state this was incorrect, to delete. This is secondary insurance. They will start a new PA via [employer] (primary insurance)

## 2020-12-23 NOTE — Telephone Encounter (Signed)
The request has been approved. The authorization is effective from 12/23/2020 to 12/22/2021, as long as the member is enrolled in their current health plan. The request was reviewed and approved by a licensed clinical pharmacist. A written notification letter will follow with additional details.  Called and spoke w/ mother. Advised PA approved and he can now fill rx at pharmacy. She verbalized understanding and appreciation.

## 2020-12-23 NOTE — Telephone Encounter (Signed)
Pt is needing a PA for the cloBAZam (ONFI) 20 MG tablet  Pt only has enough medication to last him till Sat the 27th [facility] on [address] faxed paperwork already Please advise.

## 2020-12-23 NOTE — Telephone Encounter (Signed)
Submitted urgent PA on CMM. Key: Key: [id number] - PA Case ID: [id number]. Waiting on determination from [place].

## 2020-12-23 NOTE — Telephone Encounter (Signed)
Noted, we just received request from pharmacy. We will work on this.

## 2020-12-23 NOTE — Telephone Encounter (Signed)
Pt's mother Thade Sledz (on [place]) called,here is the insurance info:  [place] Member ID: [id number] Group ID: [id number]: [id number]

## 2021-04-01 ENCOUNTER — Other Ambulatory Visit: Payer: Self-pay | Admitting: Neurology

## 2021-04-02 NOTE — Telephone Encounter (Signed)
Received refill request for Onif.  Last OV was on 04/14/20.  Next OV is scheduled for 04/14/21 .  Last RX was written on 12/24/20 for 90 tabs.   [place] [facility] Database has been reviewed.   Please fill as work in [employer].

## 2021-04-07 ENCOUNTER — Other Ambulatory Visit: Payer: Self-pay | Admitting: Neurology

## 2021-04-14 ENCOUNTER — Encounter: Payer: Self-pay | Admitting: Adult Health

## 2021-04-14 ENCOUNTER — Ambulatory Visit: Payer: 59 | Admitting: Adult Health

## 2021-04-20 ENCOUNTER — Other Ambulatory Visit: Payer: Self-pay

## 2021-04-20 ENCOUNTER — Encounter (HOSPITAL_COMMUNITY): Payer: Self-pay | Admitting: Emergency Medicine

## 2021-04-20 ENCOUNTER — Emergency Department (HOSPITAL_COMMUNITY): Payer: 59

## 2021-04-20 ENCOUNTER — Emergency Department (HOSPITAL_COMMUNITY)
Admission: EM | Admit: 2021-04-20 | Discharge: 2021-04-21 | Disposition: A | Discharge status: Home / Self Care | Payer: 59 | Attending: Emergency Medicine | Admitting: Emergency Medicine

## 2021-04-20 DIAGNOSIS — R079 Chest pain, unspecified: Secondary | ICD-10-CM

## 2021-04-20 DIAGNOSIS — S0990XA Unspecified injury of head, initial encounter: Secondary | ICD-10-CM | POA: Diagnosis present

## 2021-04-20 DIAGNOSIS — Y92410 Unspecified street and highway as the place of occurrence of the external cause: Secondary | ICD-10-CM | POA: Insufficient documentation

## 2021-04-20 DIAGNOSIS — J45909 Unspecified asthma, uncomplicated: Secondary | ICD-10-CM | POA: Insufficient documentation

## 2021-04-20 DIAGNOSIS — M25561 Pain in right knee: Secondary | ICD-10-CM | POA: Diagnosis not present

## 2021-04-20 DIAGNOSIS — M25532 Pain in left wrist: Secondary | ICD-10-CM | POA: Insufficient documentation

## 2021-04-20 DIAGNOSIS — Z85828 Personal history of other malignant neoplasm of skin: Secondary | ICD-10-CM | POA: Insufficient documentation

## 2021-04-20 DIAGNOSIS — M25562 Pain in left knee: Secondary | ICD-10-CM | POA: Diagnosis not present

## 2021-04-20 MED ORDER — SODIUM CHLORIDE 0.9 % IV BOLUS
1000.0000 mL | Freq: Once | INTRAVENOUS | Status: AC
  Administered 2021-04-21: 02:00:00 1000 mL via INTRAVENOUS

## 2021-04-20 MED ORDER — HYDROCODONE-ACETAMINOPHEN 5-325 MG PO TABS
1.0000 | ORAL_TABLET | Freq: Once | ORAL | Status: AC
  Administered 2021-04-21: 1 via ORAL
  Filled 2021-04-20: qty 1
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
## 2021-04-21 ENCOUNTER — Emergency Department (HOSPITAL_COMMUNITY): Payer: 59

## 2021-04-21 LAB — CBC
HCT: 38.6 % — ABNORMAL LOW (ref 39.0–52.0)
Hemoglobin: 13.9 g/dL (ref 13.0–17.0)
MCH: 33.6 pg (ref 26.0–34.0)
MCHC: 36 g/dL (ref 30.0–36.0)
MCV: 93.2 fL (ref 80.0–100.0)
Platelets: 205 10*3/uL (ref 150–400)
RBC: 4.14 MIL/uL — ABNORMAL LOW (ref 4.22–5.81)
RDW: 11.3 % — ABNORMAL LOW (ref 11.5–15.5)
WBC: 14.5 10*3/uL — ABNORMAL HIGH (ref 4.0–10.5)
nRBC: 0 % (ref 0.0–0.2)

## 2021-04-21 LAB — COMPREHENSIVE METABOLIC PANEL
ALT: 51 U/L — ABNORMAL HIGH (ref 0–44)
AST: 55 U/L — ABNORMAL HIGH (ref 15–41)
Albumin: 4.2 g/dL (ref 3.5–5.0)
Alkaline Phosphatase: 55 U/L (ref 38–126)
Anion gap: 7 (ref 5–15)
BUN: 7 mg/dL (ref 6–20)
CO2: 24 mmol/L (ref 22–32)
Calcium: 9.2 mg/dL (ref 8.9–10.3)
Chloride: 107 mmol/L (ref 98–111)
Creatinine, Ser: 0.84 mg/dL (ref 0.61–1.24)
GFR, Estimated: >60 mL/min (ref >60)
Glucose, Bld: 102 mg/dL — ABNORMAL HIGH (ref 70–99)
Potassium: 3.5 mmol/L (ref 3.5–5.1)
Sodium: 138 mmol/L (ref 135–145)
Total Bilirubin: 1 mg/dL (ref 0.3–1.2)
Total Protein: 6.3 g/dL — ABNORMAL LOW (ref 6.5–8.1)

## 2021-04-21 MED ORDER — FENTANYL CITRATE PF 50 MCG/ML IJ SOSY
50.0000 ug | PREFILLED_SYRINGE | Freq: Once | INTRAMUSCULAR | Status: AC
  Administered 2021-04-21: 02:00:00 50 ug via INTRAVENOUS
  Filled 2021-04-21: qty 1

## 2021-04-21 MED ORDER — HYDROCODONE-ACETAMINOPHEN 5-325 MG PO TABS: 1.0000 | ORAL_TABLET | ORAL | 0 refills | Status: DC | PRN

## 2021-04-21 NOTE — Discharge Instructions (Signed)
You were evaluated in the Emergency Department and after careful evaluation, we did not find any emergent condition requiring admission or further testing in the hospital.  Your exam/testing today was overall reassuring.  No significant injuries found on our x-rays and CT scan.  We are placing you in a splint for your wrist, recommending follow-up with the orthopedic specialist for repeat x-ray in about 2 weeks.  Recommend Tylenol 1000 mg every 4-6 hours and/or Motrin 600 mg every 4-6 hours for pain.  You can use the oxycodone medication for more significant pain.  Please return to the Emergency Department if you experience any worsening of your condition.  Thank you for allowing [place] to be a part of your care.
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
## 2021-04-21 NOTE — ED Notes (Signed)
DC instructions reviewed with pt. Pt verbalized understanding. Pt Dc.

## 2021-07-06 ENCOUNTER — Other Ambulatory Visit: Payer: Self-pay | Admitting: Neurology

## 2021-08-25 ENCOUNTER — Telehealth: Payer: Self-pay | Admitting: Neurology

## 2021-08-25 ENCOUNTER — Encounter: Payer: Self-pay | Admitting: Neurology

## 2021-08-25 ENCOUNTER — Telehealth: Payer: Self-pay | Admitting: *Deleted

## 2021-08-25 ENCOUNTER — Telehealth (INDEPENDENT_AMBULATORY_CARE_PROVIDER_SITE_OTHER): Payer: Self-pay | Admitting: Neurology

## 2021-08-25 DIAGNOSIS — F0630 Mood disorder due to known physiological condition, unspecified: Secondary | ICD-10-CM

## 2021-08-25 DIAGNOSIS — G40802 Other epilepsy, not intractable, without status epilepticus: Secondary | ICD-10-CM

## 2021-08-25 MED ORDER — DIVALPROEX SODIUM ER 500 MG PO TB24: ORAL_TABLET | ORAL | 3 refills | Status: DC

## 2021-08-25 MED ORDER — OXCARBAZEPINE 600 MG PO TABS: ORAL_TABLET | ORAL | 3 refills | Status: DC

## 2021-08-25 MED ORDER — CLOBAZAM 20 MG PO TABS: 20.0000 mg | ORAL_TABLET | Freq: Every evening | ORAL | 3 refills | Status: DC

## 2021-08-25 MED ORDER — DIVALPROEX SODIUM ER 250 MG PO TB24: 250.0000 mg | ORAL_TABLET | Freq: Every evening | ORAL | 3 refills | Status: DC

## 2021-08-25 NOTE — Patient Instructions (Signed)
Clobazam Tablets ?What is this medication? ?CLOBAZAM [facility] zam) prevents and controls seizures in people with epilepsy. It works by calming overactive nerves in your body. It belongs to a group of medications called benzodiazepines. ?This medicine may be used for other purposes; ask your health care provider or pharmacist if you have questions. ?COMMON BRAND NAME(S): ONFI ?What should I tell my care team before I take this medication? ?They need to know if you have any of these conditions: ?Kidney disease ?Liver disease ?Lung or breathing disease ?Mental health condition ?Substance use disorder ?Suicidal thoughts, plans, or attempt by you or a family member ?An unusual or allergic reaction to clobazam, other benzodiazepines, foods, dyes, or preservatives ?Pregnant or trying to get pregnant ?Breast-feeding ?How should I use this medication? ?Take this medication by mouth with a glass of water. Follow the directions on the prescription label. You can take it with or without food. If it upsets your stomach, take it with food. The tablets may be swallowed whole, split in half along the score line, or crushed and mixed in applesauce. Take your medication at regular intervals. Do not take it more often than directed. Do not stop taking except on your care team's advice. ?A special MedGuide will be given to you by the pharmacist with each prescription and refill. Be sure to read this information carefully each time. ?Talk to your care team about the use of this medication in children. While this medication may be prescribed for children as young as 16 years of age for selected conditions, precautions do apply. ?Patients over 1 years old may have a stronger reaction and need a smaller dose. ?Overdosage: If you think you have taken too much of this medicine contact a poison control center or emergency room at once. ?NOTE: This medicine is only for you. Do not share this medicine with others. ?What if I miss a dose? ?If  you miss a dose, take it as soon as you can. If it is almost time for your next dose, take only that dose. Do not take double or extra doses. ?What may interact with this medication? ?Do not take this medication with any of the following: ?Opioid medications for cough ?Sodium oxybate ?This medication may interact with the following: ?Alcohol ?Antihistamines for allergy, cough, and cold ?Certain medications for anxiety or sleep ?Certain medications for depression, like amitriptyline, fluoxetine, sertraline ?Certain medications for fungal infections like ketoconazole and itraconazole ?Certain medications for seizures like phenobarbital, primidone ?Dextromethorphan ?Estrogens and/or progestins ?General anesthetics like halothane, isoflurane, methoxyflurane, propofol ?Local anesthetics like lidocaine, pramoxine, tetracaine ?Medications that relax muscles for surgery ?Opioid medications for pain ?Omeprazole ?Phenothiazines like chlorpromazine, mesoridazine, prochlorperazine, thioridazine ?Ticlopidine ?This list may not describe all possible interactions. Give your health care provider a list of all the medicines, herbs, non-prescription drugs, or dietary supplements you use. Also tell them if you smoke, drink alcohol, or use illegal drugs. Some items may interact with your medicine. ?What should I watch for while using this medication? ?Tell your care team if your symptoms do not start to get better or if they get worse. ?This medication may cause serious skin reactions. They can happen weeks to months after starting the medication. Contact your care team right away if you notice fevers or flu-like symptoms with a rash. The rash may be red or purple and then turn into blisters or peeling of the skin. Or, you might notice a red rash with swelling of the face, lips or lymph nodes in  your neck or under your arms. ?Do not stop taking except on your care team's advice. You may develop a severe reaction. Your care team will  tell you how much medication to take. Long term use of this medication may cause your brain and body to depend on it. This can happen even when used as directed by your care team. You and your care team will work together to determine how long you will need to take this medication. ?Wear a medical ID bracelet or chain, and carry a card that describes your disease and details of your medication and dosage times. ?This medication may affect your coordination, reaction time, or judgment. Do not drive or operate machinery until you know how this medication affects you. Sit up or stand slowly to reduce the risk of dizzy or fainting spells. Drinking alcohol with this medication can increase the risk of these side effects. ?If you are taking another medication that also causes drowsiness, you may have more side effects. Give your care team a list of all medications you use. Your care team will tell you how much medication to take. Do not take more medication than directed. Call emergency for help if you have problems breathing or unusual sleepiness. ?The use of this medication may increase the chance of suicidal thoughts or actions. Pay special attention to how you are responding while on this medication. Any worsening of mood, or thoughts of suicide or dying should be reported to your care team right away. ?Estrogen and/or progestin hormones may not work as well while you are taking this medication. If you are using this medication for contraception, talk to your care team about using a second type of contraception. ?What side effects may I notice from receiving this medication? ?Side effects that you should report to your care team as soon as possible: ?Allergic reactions--skin rash, itching, hives, swelling of the face, lips, tongue, or throat ?CNS depression--slow or shallow breathing, shortness of breath, feeling faint, dizziness, confusion, trouble staying awake ?Redness, blistering, peeling, or loosening of the  skin, including inside the mouth ?Thoughts of suicide or self-harm, worsening mood, or feelings of depression ?Side effects that usually do not require medical attention (report to your care team if they continue or are bothersome): ?Constipation ?Drowsiness ?Excessive drooling ?Fever ?Unusual weakness or fatigue ?This list may not describe all possible side effects. Call your doctor for medical advice about side effects. You may report side effects to FDA at 1-800-FDA-1088. ?Where should I keep my medication? ?Keep out of the reach of children and pets. This medication can be abused. Keep it in a safe place to protect it from theft. Do not share it with anyone. It is only for you. Selling or giving away this medication is dangerous and against the law. ?Store between 20 and 25 degrees C (68 and 77 degrees F). ?This medication may cause harm and death if it is taken by other adults, children, or pets. It is important to get rid of the medication as soon as you no longer need it or it is expired. You can do this in two ways: ?Take the medication to a medication take-back program. Check with your pharmacy or law enforcement to find a location. ?If you cannot return the medication, check the label or package insert to see if the medication should be thrown out in the garbage or flushed down the toilet. If you are not sure, ask your care team. If it is safe to put in the  trash, take the medication out of the container. Mix the medication with cat litter, dirt, coffee grounds, or other unwanted substance. Seal the mixture in a bag or container. Put it in the trash. ?NOTE: This sheet is a summary. It may not cover all possible information. If you have questions about this medicine, talk to your doctor, pharmacist, or health care provider. ?? 2023 Elsevier/Gold Standard (2021-01-26 00:00:00) ? ?

## 2021-08-25 NOTE — Telephone Encounter (Signed)
Tried calling pt again. Mailbox full, unable to leave message. ?

## 2021-08-25 NOTE — Telephone Encounter (Signed)
Took call from phone staff and spoke with pt mother. She asked if son called, advised he had not. She asked him to. He thought we were just calling to remind him of appt. She did let him know we were offering appt this afternoon. He does not have car/ride to get to appt. She did tell him we could do virtual. Said its up to him to contact us at this point about appt. Otherwise, she will remind him about appt with MM,NP 08/27/21 at 11am. ?

## 2021-08-25 NOTE — Telephone Encounter (Signed)
Pt called to r/s his missed appt for Epilepsy. Pt states that he is completely out of his medications and is needing a refill on every medication. Please send to the [facility] on [address]  ?sertraline (ZOLOFT) 100 MG tablet ?oxcarbazepine (TRILEPTAL) 600 MG tablet ?cloBAZam (ONFI) 20 MG tablet ?Depakote ?

## 2021-08-25 NOTE — Telephone Encounter (Signed)
Called and spoke w/ mother, Gwenette Greet on [place]. Son living w/ grandmother right now. She is aware calling to offer appt today. Dr. Brett Fairy has opening at 2pm today. Held appt. She will reach out to son to see if he can take this and call back. ?Mother aware we cannot refill meds until he comes in to be seen. ? ?Please schedule if he is able to come in.  ?

## 2021-08-25 NOTE — Progress Notes (Signed)
? ? ?Virtual Visit via Video Note ? ?I connected with Connor Woods on 08/25/21 at  2:00 PM EDT by a video enabled telemedicine application and verified that I am speaking with the correct person using two identifiers. ? ?Location: ?Patient: at home ?Provider: at [facility]  ?  ?I discussed the limitations of evaluation and management by telemedicine and the availability of in person appointments. The patient expressed understanding and agreed to proceed. ? ?History of Present Illness: ?Very last seizure - nocturnal , 2012-2013.  ? ?  ?Observations/Objective: well controlled seizure disorder , needing refills.  ?No side effects reported, he is full time gainfully employed.  ?Not a lot of appetite , no headaches by ROS. ?No hypersomnia. Not waking up with tongue bites, bruising or enuresis.  ? ? ?Assessment and Plan: frontal lobe epilepsy , refills written, one 3 medication  ? ? ?Follow Up Instructions: Rv in 6 month, with CMET, CBC and with drug level.  ? ?I like for him to have a vision and full eye exam and physical yearly.  ? ?  ?I discussed the assessment and treatment plan with the patient. The patient was provided an opportunity to ask questions and all were answered. The patient agreed with the plan and demonstrated an understanding of the instructions. ?  ?The patient was advised to call back or seek an in-person evaluation if the symptoms worsen or if the condition fails to improve as anticipated. ? ?I provided 18 minutes of non-face-to-face time during this encounter. ? ? ?Larey Seat, MD ? ?Provider:  Larey Seat, M D  ?Referring Provider: Deland Pretty, MD ?Primary Care Physician:  Deland Pretty, MD ? ?No chief complaint on file. ? ? ?HPI:  Connor Woods is a 21 y.o. 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  ?No further seizure activity since 6 month ago- time for a refill of depakote, clobazam and oxcarbazepine.  ?Needs some baseline labs today, but  reports that Dr. Shelia Media has just done that - I have no access by [facility] and he didn't bring his smart phone or print out. - CMET, CBC and LFT. Is now fully vaccinated for Covid 19.  ? ? ? ? ? ? ?10-24-2019, as a referral  from Dr. Shelia Media for transfer of epilepsy care. ?Connor Woods was raised by his Adonis Brook. He was born at 28 weeks , 3  Pounds 5 ounces.  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 ? ?Connor Woods underwent Epilepsy surgery at [facility] in 8/ 2016, and has been seizure free since.  He had intracranial monitoring- left temporal focus was identified.  ? ?Connor Woods is a minimal 21 year old patient who from birth on have developed frequent generalized epileptic seizures, which we have previously classified as grand mal. ?5 years ago in August he underwent intracranial electrode placement and within 24 hours a seizure focus was identified within a week he underwent a right-sided temporal lobectomy.  He has since then denied to have any seizure activity, none has been witnessed, he does not have any perceived deficits in his medication regimen has been unchanged since March 2019 and it was last refilled through the [facility] offices.  Under the current guidelines I would not change his medication even if medication has led to some thrombocytopenia.  He is taking valproic acid, clobazam, oxcarbazepine. ? ?He has had extensive blood work with Dr. Shelia Media ? ?Social history:  [address] [place], [place], and [employer], [place]. ?Non smoker and non drinker. 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   ? ? ?Review of Systems: ?Out of a complete 14 system review, the patient complains of only the following symptoms, and all other reviewed systems are negative. ? ? ?Just needs refills now. Last refill by dr Connor Woods.  ? ? ?Mood reportedly stabile and no acute depression. PHQ2 - negative.  ? ?Social History  ? ?Socioeconomic History  ? Marital status:  Single  ?  Spouse name: Not on file  ? Number of children: Not on file  ? Years of education: Not on file  ? Highest education level: Not on file  ?Occupational History  ? Not on file  ?Tobacco Use  ? Smoking status: Never  ? Smokeless tobacco: Never  ?Vaping Use  ? Vaping Use: Every day  ?Substance and Sexual Activity  ? Alcohol use: No  ? Drug use: No  ? Sexual activity: Never  ?Other Topics Concern  ? Not on file  ?Social History Narrative  ? Not on file  ? ?Social Determinants of Health  ? ?Financial Resource Strain: Not on file  ?Food Insecurity: Not on file  ?Transportation Needs: Not on file  ?Physical Activity: Not on file  ?Stress: Not on file  ?Social Connections: Not on file  ?Intimate Partner Violence: Not on file  ? ? ?Family History  ?Problem Relation Age of Onset  ? Kidney disease Paternal Aunt   ? Cancer Paternal Uncle   ? Lung cancer Maternal Grandfather   ? Hypertension Paternal Grandmother   ? ? ?Past Medical History:  ?Diagnosis Date  ? Asthma   ? 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 scalp and neck  ? Epilepsy ([place])   ? frontal lobe - last seizure 08/20/2013  ? Mood swings   ? ? ?Past Surgical History:  ?Procedure Laterality Date  ? BRAIN SURGERY    ? CLOSED REDUCTION ELBOW FRACTURE Left 02/14/2010  ? ulnar fx. and radial head dislocation  ? TONSILLECTOMY AND ADENOIDECTOMY    ? UMBILICAL HERNIA REPAIR  12/26/2008  ? ? ?Current Outpatient Medications  ?Medication Sig Dispense Refill  ? cloBAZam (ONFI) 20 MG tablet TAKE 1 TABLET BY MOUTH EVERY EVENING 90 tablet 0  ? divalproex (DEPAKOTE ER) 250 MG 24 hr tablet TAKE 1 TABLET BY MOUTH EVERY EVENING 135 tablet 0  ? divalproex (DEPAKOTE ER) 500 MG 24 hr tablet TAKE 2 TABLETS(1000 MG) BY MOUTH EVERY EVENING 180 tablet 0  ? HYDROcodone-acetaminophen (NORCO/VICODIN) 5-325 MG tablet Take 1 tablet by mouth every 4 (four) hours as needed. 6 tablet 0  ? oxcarbazepine (TRILEPTAL) 600 MG tablet TAKE 1 AND 1/2 TABLETS(900 MG) BY MOUTH DAILY 135 tablet 0  ? sertraline  (ZOLOFT) 100 MG tablet TAKE 1 TABLET(100 MG) BY MOUTH DAILY 90 tablet 3  ? ?No current facility-administered medications for this visit.  ? ? ?Allergies as of 08/25/2021  ? (No Known Allergies)  ? ? ?Vitals: ?There were no vitals taken for this visit. Now weights 130 pounds, 6'1"  ? ?Last Weight:  ?Wt Readings from Last 1 Encounters:  ?04/14/20 140 lb (63.5 kg)  ? ?Last Height:   ?Ht Readings from Last 1 Encounters:  ?04/14/20 '6\' 1"'$  (1.854 m)  ? ?  ? ? ?Asencion Partridge Carley Glendenning MD ?08/25/2021 ? ? ? ? ? ? ? ? ? ? ? ? ?

## 2021-08-25 NOTE — Telephone Encounter (Signed)
Tried calling pt back. Went to VM, VM full and unable to leave message. ? ?Pt no showed appt 04/14/21. Has appt scheduled with MM,NP 08/27/21.  ?Has not been seen since 04/14/2020.  ? ?Was going to offer mychart VV this afternoon at 2pm with MM,NP but he would need to get set up with mychart. Set up currently pending. ?

## 2021-08-25 NOTE — Telephone Encounter (Signed)
Submitted PA clobazam on CMM. Key: [id number]. PA approved immediately: ?"CaseId:[id number];Status:Approved;Review Type:Prior Auth;Coverage Start Date:08/25/2021;Coverage End Date:08/25/2022;" ?

## 2021-08-27 ENCOUNTER — Ambulatory Visit: Payer: Medicaid Other | Admitting: Adult Health

## 2022-01-24 ENCOUNTER — Ambulatory Visit (HOSPITAL_COMMUNITY)
Admission: EM | Admit: 2022-01-24 | Discharge: 2022-01-24 | Disposition: A | Discharge status: Home / Self Care | Payer: Commercial Managed Care - HMO

## 2022-01-24 DIAGNOSIS — F1994 Other psychoactive substance use, unspecified with psychoactive substance-induced mood disorder: Secondary | ICD-10-CM | POA: Diagnosis not present

## 2022-01-24 DIAGNOSIS — F1210 Cannabis abuse, uncomplicated: Secondary | ICD-10-CM | POA: Diagnosis not present

## 2022-01-24 NOTE — ED Triage Notes (Signed)
Pt presents to [facility] accompanied by his grandmother seeking detox, substance use treatment for Marijuana. Pt states he uses Marijuana daily and last use was 9/22, a few puffs. Pt states he would like to be admitted to this facility for a few days to help him to stop smoking Marijuana. Pt currently resides with his grandmother who has encouraged him to seek treatment. Pt denies SI/HI and AVH.

## 2022-01-24 NOTE — Discharge Instructions (Signed)
Take all medications as prescribed. Keep all follow-up appointments as scheduled.  Do not consume alcohol or use illegal drugs while on prescription medications. Report any adverse effects from your medications to your primary care provider promptly.  In the event of recurrent symptoms or worsening symptoms, call 911, a crisis hotline, or go to the nearest emergency department for evaluation.   

## 2022-01-24 NOTE — ED Provider Notes (Signed)
Behavioral Health Urgent Care Medical Screening Exam  Patient Name: Connor Woods MRN: [id number] Date of Evaluation: 01/24/22 Chief Complaint:   Diagnosis:  Final diagnoses:  Substance induced mood disorder ([place])  Marijuana abuse    History of Present illness: Connor Woods is a 21 y.o. male.  Presents to [facility] urgent care accompanied by his grandmother.  Grandmother is requesting inpatient admission due to " excessive marijuana use."  Patient reports smoking marijuana daily.  Reports using alcohol occasionally mainly weekends.  States my grandmother wanted me to follow-up due to my anger issues and marijuana use.  He is denying suicidal or homicidal ideations.  Denies auditory visual hallucinations.  Denies paranoia ideations.  States he has been using marijuana since 21 years old.  He denied any other illicit substance abuse to include cocaine, heroin or opiates.  States he last had a "shot of Hennessy" 2 nights ago. Reported history with epilepsy.  Denied history with delirium tremors or alcohol withdrawal seizures.  Discussed following up with outpatient services to consider chemical dependency program.  We will make additional outpatient resources available for psychiatry and therapy services.  Case staffed with attending psychiatrist Cinderella. [name], 21 y.o., male patient seen face to face by this provider, consulted with psychiatrist Cinderella.   During evaluation Connor Woods is sitting in no acute distress. he is alert/oriented x 4; calm/cooperative; and mood congruent with affect. he is speaking in a clear tone at moderate volume, and normal pace; with good eye contact.  His thought process is coherent and relevant; There is no indication that he is currently responding to internal/external stimuli or experiencing delusional thought content; and he has denied suicidal/self-harm/homicidal ideation, psychosis, and paranoia.   Patient has remained calm throughout  assessment and has answered questions appropriately.    At this time [name] is educated and verbalizes understanding of mental health resources and other crisis services in the community.He  is instructed to call [phone] and present to the nearest emergency room should he experience any suicidal/homicidal ideation, auditory/visual/hallucinations, or detrimental worsening of his mental health condition. he was a also advised by Probation officer that he could call the toll-free phone on insurance card to assist with identifying in network counselors and agencies or number on back of Medicaid card to speak with care coordinator.   Psychiatric Specialty Exam  Presentation  General Appearance:Appropriate for Environment  Eye Contact:Good  Speech:Clear and Coherent  Speech Volume:Normal  Handedness:No data recorded  Mood and Affect  Mood:Anxious Affect:Congruent  Thought Process  Thought Processes:Coherent Descriptions of Associations:Intact  Orientation:Full (Time, Place and Person)  Thought Content:Logical    Hallucinations:None  Ideas of Reference:None  Suicidal Thoughts:No  Homicidal Thoughts:No   Sensorium  Memory:Immediate Good; Recent Good; Remote Good Judgment:Good Insight:Good  Executive Functions  Concentration:Fair Attention Span:Good [place] Language:Good  Psychomotor Activity  Psychomotor Activity:Normal  Assets  Assets:Communication Skills; Social Support  Sleep  Sleep:Fair Number of hours: No data recorded  Nutritional Assessment (For OBS and FBC admissions only) Has the patient had a weight loss or gain of 10 pounds or more in the last 3 months?: No Has the patient had a decrease in food intake/or appetite?: No Does the patient have dental problems?: No Does the patient have eating habits or behaviors that may be indicators of an eating disorder including binging or inducing vomiting?: No Has the patient recently lost  weight without trying?: 0 Has the patient been eating poorly because of  a decreased appetite?: 0 Malnutrition Screening Tool Score: 0    Physical Exam: Physical Exam Vitals and nursing note reviewed.  Cardiovascular:     Rate and Rhythm: Normal rate.     Pulses: Normal pulses.     Heart sounds: Normal heart sounds.  Neurological:     Mental Status: He is alert and oriented to person, place, and time.  Psychiatric:        Mood and Affect: Mood normal.        Behavior: Behavior normal.    Review of Systems  Cardiovascular: Negative.   Gastrointestinal: Negative.   Genitourinary: Negative.   Psychiatric/Behavioral:  Positive for substance abuse. The patient is nervous/anxious.   All other systems reviewed and are negative.  Blood pressure 118/78, pulse 63, temperature 98.5 F (36.9 C), temperature source Oral, resp. rate 18, SpO2 99 %. There is no height or weight on file to calculate BMI.  Musculoskeletal: Strength & Muscle Tone: within normal limits Gait & Station: normal Patient leans: N/A   [place] [facility] Discharge Disposition for Follow up and Recommendations: Based on my evaluation the patient does not appear to have an emergency medical condition and can be discharged with resources and follow up care in outpatient services for Substance Abuse Intensive Outpatient Program and Individual Therapy   Derrill Center, NP 01/24/2022, 12:13 PM

## 2022-01-25 ENCOUNTER — Encounter (HOSPITAL_BASED_OUTPATIENT_CLINIC_OR_DEPARTMENT_OTHER): Payer: Self-pay | Admitting: Emergency Medicine

## 2022-01-25 ENCOUNTER — Emergency Department (HOSPITAL_BASED_OUTPATIENT_CLINIC_OR_DEPARTMENT_OTHER): Payer: Commercial Managed Care - HMO | Admitting: Radiology

## 2022-01-25 ENCOUNTER — Emergency Department (HOSPITAL_BASED_OUTPATIENT_CLINIC_OR_DEPARTMENT_OTHER)
Admission: EM | Admit: 2022-01-25 | Discharge: 2022-01-26 | Disposition: A | Discharge status: Home / Self Care | Payer: Commercial Managed Care - HMO | Attending: Emergency Medicine | Admitting: Emergency Medicine

## 2022-01-25 ENCOUNTER — Other Ambulatory Visit: Payer: Self-pay

## 2022-01-25 DIAGNOSIS — J45909 Unspecified asthma, uncomplicated: Secondary | ICD-10-CM | POA: Diagnosis not present

## 2022-01-25 DIAGNOSIS — J020 Streptococcal pharyngitis: Secondary | ICD-10-CM | POA: Diagnosis not present

## 2022-01-25 DIAGNOSIS — R079 Chest pain, unspecified: Secondary | ICD-10-CM | POA: Diagnosis present

## 2022-01-25 DIAGNOSIS — Z20822 Contact with and (suspected) exposure to covid-19: Secondary | ICD-10-CM | POA: Diagnosis not present

## 2022-01-25 LAB — BASIC METABOLIC PANEL
Anion gap: 12 (ref 5–15)
BUN: 18 mg/dL (ref 6–20)
CO2: 24 mmol/L (ref 22–32)
Calcium: 9.8 mg/dL (ref 8.9–10.3)
Chloride: 102 mmol/L (ref 98–111)
Creatinine, Ser: 0.74 mg/dL (ref 0.61–1.24)
GFR, Estimated: >60 mL/min (ref >60)
Glucose, Bld: 99 mg/dL (ref 70–99)
Potassium: 3.6 mmol/L (ref 3.5–5.1)
Sodium: 138 mmol/L (ref 135–145)

## 2022-01-25 LAB — CBC
HCT: 44.1 % (ref 39.0–52.0)
Hemoglobin: 16 g/dL (ref 13.0–17.0)
MCH: 32.2 pg (ref 26.0–34.0)
MCHC: 36.3 g/dL — ABNORMAL HIGH (ref 30.0–36.0)
MCV: 88.7 fL (ref 80.0–100.0)
Platelets: 257 K/uL (ref 150–400)
RBC: 4.97 MIL/uL (ref 4.22–5.81)
RDW: 12.2 % (ref 11.5–15.5)
WBC: 8.2 K/uL (ref 4.0–10.5)
nRBC: 0 % (ref 0.0–0.2)

## 2022-01-25 LAB — RESP PANEL BY RT-PCR (FLU A&B, COVID) ARPGX2
Influenza A by PCR: NEGATIVE
Influenza B by PCR: NEGATIVE
SARS Coronavirus 2 by RT PCR: NEGATIVE

## 2022-01-25 LAB — TROPONIN I (HIGH SENSITIVITY): Troponin I (High Sensitivity): <2 ng/L

## 2022-01-25 LAB — GROUP A STREP BY PCR: Group A Strep by PCR: DETECTED — AB

## 2022-01-25 MED ORDER — PENICILLIN G BENZATHINE 1200000 UNIT/2ML IM SUSY
1.2000 10*6.[IU] | PREFILLED_SYRINGE | Freq: Once | INTRAMUSCULAR | Status: AC
  Administered 2022-01-26: 1.2 10*6.[IU] via INTRAMUSCULAR
  Filled 2022-01-25: qty 2

## 2022-01-25 MED ORDER — DEXAMETHASONE 10 MG/ML FOR PEDIATRIC ORAL USE
10.0000 mg | Freq: Once | INTRAMUSCULAR | Status: AC
  Administered 2022-01-26: 10 mg via ORAL
  Filled 2022-01-25: qty 1

## 2022-01-25 NOTE — ED Triage Notes (Signed)
Woke up with SOB chest pressure, sore throat.

## 2022-01-25 NOTE — ED Provider Notes (Signed)
[facility] Provider Note: Georgena Spurling, MD, FACEP  CSN: [id number] MRN: [id number] ARRIVAL: 01/25/22 at 2124 ROOM: [place]  01/25/22 11:40 PM Connor Woods is a 21 y.o. male who awakened from sleep this evening with a sensation of choking as well as chest pain, sore throat and shortness of breath.  He rates the pain in his throat currently as a 6 out of 10 but rated it as an 8 out of 10 earlier.  He is still having pain in the chest but his shortness of breath is resolved.    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 and radial head dislocation   TONSILLECTOMY AND ADENOIDECTOMY     UMBILICAL HERNIA REPAIR  12/26/2008    Family History  Problem Relation Age of Onset   Kidney disease Paternal Aunt    Cancer Paternal Uncle    Lung cancer Maternal Grandfather    Hypertension Paternal Grandmother     Social History   Tobacco Use   Smoking status: Never   Smokeless tobacco: Never  Vaping Use   Vaping Use: Every day  Substance Use Topics   Alcohol use: No   Drug use: No    Prior to Admission medications   Medication Sig Start Date End Date Taking? Authorizing Provider  cloBAZam (ONFI) 20 MG tablet Take 1 tablet (20 mg total) by mouth every evening. 08/25/21   Dohmeier, Asencion Partridge, MD  divalproex (DEPAKOTE ER) 250 MG 24 hr tablet Take 1 tablet (250 mg total) by mouth every evening. 08/25/21   Dohmeier, Asencion Partridge, MD  divalproex (DEPAKOTE ER) 500 MG 24 hr tablet TAKE 2 TABLETS(1000 MG) BY MOUTH EVERY EVENING 08/25/21   Dohmeier, Asencion Partridge, MD  HYDROcodone-acetaminophen (NORCO/VICODIN) 5-325 MG tablet Take 1 tablet by mouth every 4 (four) hours as  needed. 04/21/21   Maudie Flakes, MD  oxcarbazepine (TRILEPTAL) 600 MG tablet TAKE 1 AND 1/2 TABLETS(900 MG) BY MOUTH DAILY 08/25/21   Dohmeier, Asencion Partridge, MD  sertraline (ZOLOFT) 100 MG tablet TAKE 1 TABLET(100 MG) BY MOUTH DAILY 04/18/20   Dohmeier, Asencion Partridge, MD    Allergies Patient has no known allergies.   REVIEW OF SYSTEMS  Negative except as noted here or in the History of Present Illness.   PHYSICAL EXAMINATION  Initial Vital Signs Blood pressure 125/85, pulse 61, temperature 97.6 F (36.4 C), resp. rate (!) 22, SpO2 99 %.  Examination General: Well-developed, well-nourished male in no acute distress; appearance consistent with age of record HENT: normocephalic; atraumatic; no pharyngeal erythema or exudate; uvula midline Eyes: Normal appearance Neck: supple; no lymphadenopathy Heart: regular rate and rhythm Lungs: clear to auscultation bilaterally Abdomen: soft; nondistended; nontender; bowel sounds present Extremities: No deformity; full range of motion; pulses normal Neurologic: Awake, alert and oriented; motor function intact in all extremities and symmetric; no facial droop Skin: Warm and dry Psychiatric: Normal mood and affect   RESULTS  Summary of this visit's results, reviewed and interpreted by myself:   EKG Interpretation  Date/Time:  Monday January 25 2022 21:44:14 EDT Ventricular Rate:  68 PR Interval:  124  QRS Duration: 92 QT Interval:  372 QTC Calculation: 395 R Axis:   83 Text Interpretation: Normal sinus rhythm No significant change was found Confirmed by Litsy Epting, Jenny Reichmann [phone]) on 01/25/2022 11:41:43 PM       Laboratory Studies: Results for orders placed or performed during the hospital encounter of 01/25/22 (from the past 24 hour(s))  Basic metabolic panel     Status: None   Collection Time: 01/25/22  9:49 PM  Result Value Ref Range   Sodium 138 135 - 145 mmol/L   Potassium 3.6 3.5 - 5.1 mmol/L   Chloride 102 98 - 111 mmol/L   CO2 24 22 - 32  mmol/L   Glucose, Bld 99 70 - 99 mg/dL   BUN 18 6 - 20 mg/dL   Creatinine, Ser 0.74 0.61 - 1.24 mg/dL   Calcium 9.8 8.9 - 10.3 mg/dL   GFR, Estimated >60 >60 mL/min   Anion gap 12 5 - 15  CBC     Status: Abnormal   Collection Time: 01/25/22  9:49 PM  Result Value Ref Range   WBC 8.2 4.0 - 10.5 K/uL   RBC 4.97 4.22 - 5.81 MIL/uL   Hemoglobin 16.0 13.0 - 17.0 g/dL   HCT 44.1 39.0 - 52.0 %   MCV 88.7 80.0 - 100.0 fL   MCH 32.2 26.0 - 34.0 pg   MCHC 36.3 (H) 30.0 - 36.0 g/dL   RDW 12.2 11.5 - 15.5 %   Platelets 257 150 - 400 K/uL   nRBC 0.0 0.0 - 0.2 %  Troponin I (High Sensitivity)     Status: None   Collection Time: 01/25/22  9:49 PM  Result Value Ref Range   Troponin I (High Sensitivity) <2 <18 ng/L  Resp Panel by RT-PCR (Flu A&B, Covid) Anterior Nasal Swab     Status: None   Collection Time: 01/25/22  9:51 PM   Specimen: Anterior Nasal Swab  Result Value Ref Range   SARS Coronavirus 2 by RT PCR NEGATIVE NEGATIVE   Influenza A by PCR NEGATIVE NEGATIVE   Influenza B by PCR NEGATIVE NEGATIVE  Group A Strep by PCR     Status: Abnormal   Collection Time: 01/25/22  9:51 PM   Specimen: Anterior Nasal Swab; Sterile Swab  Result Value Ref Range   Group A Strep by PCR DETECTED (A) NOT DETECTED   Imaging Studies: DG Chest 2 View  Result Date: 01/25/2022 CLINICAL DATA:  Chest pain EXAM: CHEST - 2 VIEW COMPARISON:  04/21/2021 FINDINGS: The heart size and mediastinal contours are within normal limits. Both lungs are clear. The visualized skeletal structures are unremarkable. IMPRESSION: No active cardiopulmonary disease. Electronically Signed   By: Donavan Foil M.D.   On: 01/25/2022 22:24    ED COURSE and MDM  Nursing notes, initial and subsequent vitals signs, including pulse oximetry, reviewed and interpreted by myself.  Vitals:   01/25/22 2141  BP: 125/85  Pulse: 61  Resp: (!) 22  Temp: 97.6 F (36.4 C)  SpO2: 99%   Medications  penicillin g benzathine (BICILLIN LA)  1200000 UNIT/2ML injection 1.2 Million Units (has no administration in time range)  dexamethasone (DECADRON) 10 MG/ML injection for Pediatric ORAL use 10 mg (has no administration in time range)    The patient is positive for strep pharyngitis.  EKG and troponin are within normal limits.  His symptoms are not consistent with cardiac etiology.  Strep is not a common cause of chest symptoms but we will treat with  Bicillin LA given his sore throat and positive test.  There is no evidence of airway occlusion or difficulty breathing on examination.  PROCEDURES  Procedures   ED DIAGNOSES     ICD-10-CM   1. Strep pharyngitis  J02.0          Laraina Sulton, MD 01/25/22 2349

## 2022-01-26 NOTE — ED Notes (Signed)
Pt verbalizes understanding of discharge instructions. Opportunity for questioning and answers were provided. Pt discharged from ED to home with family.    

## 2022-02-08 ENCOUNTER — Telehealth: Payer: Self-pay | Admitting: Adult Health

## 2022-02-08 NOTE — Telephone Encounter (Signed)
Phone out of service, sent mychart msg informing pt of r/s needed for 10/26 appt- NP out.

## 2022-02-25 ENCOUNTER — Ambulatory Visit: Payer: Medicaid Other | Admitting: Adult Health
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
## 2022-04-07 ENCOUNTER — Other Ambulatory Visit: Payer: Self-pay | Admitting: Neurology

## 2022-04-08 ENCOUNTER — Telehealth: Payer: Self-pay | Admitting: Neurology

## 2022-04-08 MED ORDER — CLOBAZAM 20 MG PO TABS: 20.0000 mg | ORAL_TABLET | Freq: Every evening | ORAL | 0 refills | Status: DC

## 2022-04-08 NOTE — Telephone Encounter (Signed)
Called [facility] at  [phone]. Spoke w/ Apolonio Schneiders. No refills remain on file for clobazam. Rx on file expired 02/23/22. Rx last sent 08/25/21 #90, 3 refills. However, rx only is good for 6 months. They will need new rx sent.  Pt last seen 08/25/21. Appt 02/25/22 cx d/t MM,NP being out.   Called mother back. Scheduled f/u with MM,NP 04/12/22 at 11:30am. Aware covering MD, Dr. Billey Gosling will be sending in refill since Dr. Brett Fairy out.   She confirmed he is not out of medication, has enough through the weekend.

## 2022-04-08 NOTE — Telephone Encounter (Signed)
Pt's mother, Jabier Deese (on [place]) would like a call back to discuss why cloBAZam (ONFI) 20 MG tablet was refused

## 2022-04-12 ENCOUNTER — Encounter: Payer: Self-pay | Admitting: Adult Health

## 2022-04-12 ENCOUNTER — Ambulatory Visit: Payer: Commercial Managed Care - HMO | Admitting: Adult Health

## 2022-04-12 VITALS — BP 104/65 | HR 74 | Ht 72.0 in | Wt 140.4 lb

## 2022-04-12 DIAGNOSIS — Z5181 Encounter for therapeutic drug level monitoring: Secondary | ICD-10-CM | POA: Diagnosis not present

## 2022-04-12 DIAGNOSIS — G40802 Other epilepsy, not intractable, without status epilepticus: Secondary | ICD-10-CM

## 2022-04-12 NOTE — Progress Notes (Addendum)
PATIENT: Connor Woods DOB: 14-Jul-2000  REASON FOR VISIT: follow up HISTORY FROM: patient PRIMARY NEUROLOGIST: Dr. Brett Fairy  Chief Complaint  Patient presents with   Follow-up    Pt in 5 pt here for seizure f/u  Pt states needs refill on clobazam. Pt states no questions or concerns for this visit      HISTORY OF PRESENT ILLNESS: Today 04/12/22  Connor Woods is a 21 y.o. male who has been followed in this office for seizures. Returns today for follow-up.  He is currently on Depakote but unsure of dose.  He also takes Onfi 20 mg daily.  He has not had any seizure events.  Operates a motor vehicle without difficulty.  Able to complete all ADLs independently. Reports that he does not feel that zoloft is working well.  His PCP originally prescribed this.  He denies any thoughts of harming himself or others.  Does not feel that worsening mood at current with Onfi.  Returns today for evaluation.  HISTORY HPI:  Connor Woods is a 21 y.o. 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  No further seizure activity since 6 month ago- time for a refill of depakote, clobazam and oxcarbazepine.  Needs some baseline labs today, but reports that Dr. Shelia Media has just done that - I have no access by [facility] and he didn't bring his smart phone or print out. - CMET, CBC and LFT. Is now fully vaccinated for Covid 19.   REVIEW OF SYSTEMS: Out of a complete 14 system review of symptoms, the patient complains only of the following symptoms, and all other reviewed systems are negative.  ALLERGIES: No Known Allergies  HOME MEDICATIONS: Outpatient Medications Prior to Visit  Medication Sig Dispense Refill   cloBAZam (ONFI) 20 MG tablet Take 1 tablet (20 mg total) by mouth every evening. 90 tablet 0   divalproex (DEPAKOTE ER) 250 MG 24 hr tablet Take 1 tablet (250 mg total) by mouth every evening. 135 tablet 3   divalproex (DEPAKOTE ER) 500 MG 24 hr tablet TAKE 2  TABLETS(1000 MG) BY MOUTH EVERY EVENING 180 tablet 3   HYDROcodone-acetaminophen (NORCO/VICODIN) 5-325 MG tablet Take 1 tablet by mouth every 4 (four) hours as needed. 6 tablet 0   oxcarbazepine (TRILEPTAL) 600 MG tablet TAKE 1 AND 1/2 TABLETS(900 MG) BY MOUTH DAILY 135 tablet 3   sertraline (ZOLOFT) 100 MG tablet TAKE 1 TABLET(100 MG) BY MOUTH DAILY 90 tablet 3   No facility-administered medications prior to visit.    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 and radial head dislocation   TONSILLECTOMY AND ADENOIDECTOMY     UMBILICAL HERNIA REPAIR  12/26/2008    FAMILY HISTORY: Family History  Problem Relation Age of Onset   Kidney disease Paternal Aunt    Cancer Paternal Uncle    Lung cancer Maternal Grandfather    Hypertension Paternal Grandmother    Epilepsy Neg Hx     SOCIAL HISTORY: Social History   Socioeconomic History   Marital status: Single    Spouse name:  Not on file   Number of children: Not on file   Years of education: Not on file   Highest education level: Not on file  Occupational History   Not on file  Tobacco Use   Smoking status: Never   Smokeless tobacco: Never  Vaping Use   Vaping Use: Former  Substance and Sexual Activity   Alcohol use: Yes    Alcohol/week: 2.0 standard drinks of alcohol    Types: 1 Glasses of wine, 1 Cans of beer per week   Drug use: No   Sexual activity: Never  Other Topics Concern   Not on file  Social History Narrative   Not on file   Social Determinants of Health   Financial Resource Strain: Not on file  Food Insecurity: Not on file  Transportation Needs: Not on file  Physical Activity: Not on file  Stress:  Not on file  Social Connections: Not on file  Intimate Partner Violence: Not on file      PHYSICAL EXAM  Vitals:   04/12/22 1131  BP: 104/65  Pulse: 74  Weight: 140 lb 6.4 oz (63.7 kg)  Height: 6' (1.829 m)   Body mass index is 19.04 kg/m.  Generalized: Well developed, in no acute distress   Neurological examination  Mentation: Alert oriented to time, place, history taking. Follows all commands speech and language fluent Cranial nerve II-XII: Pupils were equal round reactive to light. Extraocular movements were full, visual field were full on confrontational test. Facial sensation and strength were normal.  Head turning and shoulder shrug  were normal and symmetric. Motor: The motor testing reveals 5 over 5 strength of all 4 extremities. Good symmetric motor tone is noted throughout.  Sensory: Sensory testing is intact to soft touch on all 4 extremities. No evidence of extinction is noted.  Coordination: Cerebellar testing reveals good finger-nose-finger and heel-to-shin bilaterally.  Gait and station: Gait is normal.  Reflexes: Deep tendon reflexes are symmetric and normal bilaterally.   DIAGNOSTIC DATA (LABS, IMAGING, TESTING) - I reviewed patient records, labs, notes, testing and imaging myself where available.  Lab Results  Component Value Date   WBC 8.2 01/25/2022   HGB 16.0 01/25/2022   HCT 44.1 01/25/2022   MCV 88.7 01/25/2022   PLT 257 01/25/2022      Component Value Date/Time   NA 138 01/25/2022 2149   K 3.6 01/25/2022 2149   CL 102 01/25/2022 2149   CO2 24 01/25/2022 2149   GLUCOSE 99 01/25/2022 2149   BUN 18 01/25/2022 2149   CREATININE 0.74 01/25/2022 2149   CALCIUM 9.8 01/25/2022 2149   PROT 6.3 (L) 04/20/2021 0028   ALBUMIN 4.2 04/20/2021 0028   AST 55 (H) 04/20/2021 0028   ALT 51 (H) 04/20/2021 0028   ALKPHOS 55 04/20/2021 0028   BILITOT 1.0 04/20/2021 0028   GFRNONAA >60 01/25/2022 2149   GFRAA >60 10/05/2019 0910   Lab Results  Component  Value Date   CHOL 148 03/19/2013   HDL 40 03/19/2013   LDLCALC 81 03/19/2013   TRIG 137 03/19/2013   CHOLHDL 3.7 03/19/2013   Lab Results  Component Value Date   HGBA1C 4.8 03/19/2013   No results found for: "VITAMINB12" Lab Results  Component Value Date   TSH 6.865 (H) 03/19/2013      ASSESSMENT AND PLAN 21 y.o. year old male  has a past medical history of Asthma, Benign neoplasm (08/2013), Epilepsy ([place]), and Mood swings. here with:  1.  Seizures  -  Continue Onfi 20 mg daily -Some confusion about current dose of Depakote.  He will check his prescription bottle at home and let me know how he is taking his medication -Blood work today -Advised to schedule an appoint with his PCP to discuss Zoloft -Follow-up in 6 months or sooner if needed     Ward Givens, MSN, NP-C 04/12/2022, 11:31 AM [facility] [address] [place], [place] [address] [phone]

## 2022-04-12 NOTE — Patient Instructions (Signed)
Your Plan:  Continue onfi  Check Depakote prescription and call us and let us know what dose you are taking Blood work today      Thank you for coming to see [place] at [facility]. I hope we have been able to provide you high quality care today.  You may receive a patient satisfaction survey over the next few weeks. We would appreciate your feedback and comments so that we may continue to improve ourselves and the health of our patients.

## 2022-04-13 LAB — COMPREHENSIVE METABOLIC PANEL
ALT: 10 IU/L (ref 0–44)
AST: 14 IU/L (ref 0–40)
Albumin/Globulin Ratio: 2.3 — ABNORMAL HIGH (ref 1.2–2.2)
Albumin: 4.8 g/dL (ref 4.3–5.2)
Alkaline Phosphatase: 79 IU/L (ref 44–121)
BUN/Creatinine Ratio: 12 (ref 9–20)
BUN: 9 mg/dL (ref 6–20)
Bilirubin Total: 0.2 mg/dL (ref 0.0–1.2)
CO2: 26 mmol/L (ref 20–29)
Calcium: 9.7 mg/dL (ref 8.7–10.2)
Chloride: 102 mmol/L (ref 96–106)
Creatinine, Ser: 0.77 mg/dL (ref 0.76–1.27)
Globulin, Total: 2.1 g/dL (ref 1.5–4.5)
Glucose: 74 mg/dL (ref 70–99)
Potassium: 4.3 mmol/L (ref 3.5–5.2)
Sodium: 142 mmol/L (ref 134–144)
Total Protein: 6.9 g/dL (ref 6.0–8.5)
eGFR: 130 mL/min/{1.73_m2} (ref >59)

## 2022-04-13 LAB — CBC WITH DIFFERENTIAL/PLATELET
Basophils Absolute: 0 10*3/uL (ref 0.0–0.2)
Basos: 0 %
EOS (ABSOLUTE): 0.1 10*3/uL (ref 0.0–0.4)
Eos: 2 %
Hematocrit: 46 % (ref 37.5–51.0)
Hemoglobin: 16.1 g/dL (ref 13.0–17.7)
Immature Grans (Abs): 0 10*3/uL (ref 0.0–0.1)
Immature Granulocytes: 0 %
Lymphocytes Absolute: 1.6 10*3/uL (ref 0.7–3.1)
Lymphs: 49 %
MCH: 33.5 pg — ABNORMAL HIGH (ref 26.6–33.0)
MCHC: 35 g/dL (ref 31.5–35.7)
MCV: 96 fL (ref 79–97)
Monocytes Absolute: 0.5 10*3/uL (ref 0.1–0.9)
Monocytes: 16 %
Neutrophils Absolute: 1.1 10*3/uL — ABNORMAL LOW (ref 1.4–7.0)
Neutrophils: 33 %
Platelets: 96 10*3/uL — CL (ref 150–450)
RBC: 4.81 x10E6/uL (ref 4.14–5.80)
RDW: 12.9 % (ref 11.6–15.4)
WBC: 3.3 10*3/uL — ABNORMAL LOW (ref 3.4–10.8)

## 2022-04-13 LAB — VALPROIC ACID LEVEL: Valproic Acid Lvl: 132 ug/mL (ref 50–100)

## 2022-04-15 ENCOUNTER — Telehealth: Payer: Self-pay | Admitting: *Deleted

## 2022-04-15 NOTE — Telephone Encounter (Signed)
Spoke with [employer]. She will have the patient here end of day one day next week (not Friday d/t office closure). Her questions were answered.

## 2022-04-15 NOTE — Telephone Encounter (Signed)
I spoke with patient's grandmother (mother) Gwenette Greet (on [place]) and discussed the blood work results as noted below.  She confirmed the patient takes a total of 1250 mg of Depakote every evening before bed.  He takes two of the 500 mg and one of the 250 mg tablet. she is amenable to a lab recheck and would like to know when he should come.  I told her most likely would check late one afternoon so it will be a trough level.  Will call her back with Megan's instructions.

## 2022-04-15 NOTE — Telephone Encounter (Signed)
-----   Message from Ward Givens, NP sent at 04/13/2022  3:25 PM EST ----- Please call the patient in the office he did not know his Depakote dose.  His level is elevated.  Please verify his dosage and then advised that we need to recheck blood work.  Platelets was also low.  Recheck Depakote and CBC

## 2022-04-15 NOTE — Telephone Encounter (Signed)
Anyday and at the end of day

## 2022-04-22 ENCOUNTER — Other Ambulatory Visit (INDEPENDENT_AMBULATORY_CARE_PROVIDER_SITE_OTHER): Payer: Self-pay

## 2022-04-22 ENCOUNTER — Other Ambulatory Visit: Payer: Self-pay | Admitting: *Deleted

## 2022-04-22 DIAGNOSIS — Z5181 Encounter for therapeutic drug level monitoring: Secondary | ICD-10-CM

## 2022-04-22 DIAGNOSIS — Z0289 Encounter for other administrative examinations: Secondary | ICD-10-CM

## 2022-04-23 LAB — VALPROIC ACID LEVEL: Valproic Acid Lvl: 48 ug/mL — ABNORMAL LOW (ref 50–100)

## 2022-04-23 LAB — CBC
Hematocrit: 45.7 % (ref 37.5–51.0)
Hemoglobin: 16.2 g/dL (ref 13.0–17.7)
MCH: 33.9 pg — ABNORMAL HIGH (ref 26.6–33.0)
MCHC: 35.4 g/dL (ref 31.5–35.7)
MCV: 96 fL (ref 79–97)
Platelets: 154 10*3/uL (ref 150–450)
RBC: 4.78 x10E6/uL (ref 4.14–5.80)
RDW: 12.4 % (ref 11.6–15.4)
WBC: 5.7 10*3/uL (ref 3.4–10.8)

## 2022-07-15 ENCOUNTER — Other Ambulatory Visit: Payer: Self-pay | Admitting: *Deleted

## 2022-07-15 MED ORDER — CLOBAZAM 20 MG PO TABS: 20.0000 mg | ORAL_TABLET | Freq: Every evening | ORAL | 0 refills | Status: DC

## 2022-07-15 NOTE — Telephone Encounter (Addendum)
Pt last seen on 04/12/2022 No follow up scheduled  Rx last filled on 04/13/22 #90 tablets  Rx pending to be signed

## 2022-11-23 ENCOUNTER — Other Ambulatory Visit (HOSPITAL_BASED_OUTPATIENT_CLINIC_OR_DEPARTMENT_OTHER): Payer: Self-pay

## 2022-11-23 ENCOUNTER — Other Ambulatory Visit: Payer: Self-pay | Admitting: Adult Health

## 2022-11-23 ENCOUNTER — Encounter (HOSPITAL_BASED_OUTPATIENT_CLINIC_OR_DEPARTMENT_OTHER): Payer: Self-pay | Admitting: Pharmacist

## 2022-11-23 ENCOUNTER — Other Ambulatory Visit: Payer: Self-pay | Admitting: *Deleted

## 2022-11-23 DIAGNOSIS — G40802 Other epilepsy, not intractable, without status epilepticus: Secondary | ICD-10-CM

## 2022-11-23 MED ORDER — DIVALPROEX SODIUM ER 250 MG PO TB24
250.0000 mg | ORAL_TABLET | Freq: Every evening | ORAL | 0 refills | Status: DC
  Filled 2022-11-23: qty 30, 30d supply, fill #0
  Filled 2022-12-31: qty 30, 30d supply, fill #1
  Filled 2023-01-29 (×2): qty 30, 30d supply, fill #2

## 2022-11-23 MED ORDER — DIVALPROEX SODIUM ER 500 MG PO TB24
1000.0000 mg | ORAL_TABLET | Freq: Every evening | ORAL | 0 refills | Status: DC
  Filled 2022-11-23: qty 60, 30d supply, fill #0
  Filled 2022-12-31: qty 60, 30d supply, fill #1
  Filled 2023-01-29 (×2): qty 60, 30d supply, fill #2

## 2022-11-23 MED ORDER — CLOBAZAM 20 MG PO TABS
20.0000 mg | ORAL_TABLET | Freq: Every evening | ORAL | 0 refills | Status: DC
  Filled 2022-11-23: qty 30, 30d supply, fill #0
  Filled 2022-12-31: qty 30, 30d supply, fill #1
  Filled 2023-02-02 (×2): qty 30, 30d supply, fill #2

## 2022-11-23 MED ORDER — OXCARBAZEPINE 600 MG PO TABS
900.0000 mg | ORAL_TABLET | Freq: Every day | ORAL | 0 refills | Status: DC
  Filled 2022-11-23: qty 45, 30d supply, fill #0
  Filled 2022-12-31: qty 45, 30d supply, fill #1
  Filled 2023-01-29 (×2): qty 45, 30d supply, fill #2

## 2022-11-23 NOTE — Telephone Encounter (Signed)
Spoke to grandmother (checked DPR) Made grandmother will be needing approval for some of the medication listed below ,[facility]

## 2022-11-23 NOTE — Telephone Encounter (Signed)
Pt's grandmother, Mycah Formica request refill for oxcarbazepine (TRILEPTAL) 600 MG tablet and divalproex (DEPAKOTE ER) 250 MG 24 hr tablet and  divalproex (DEPAKOTE ER) 500 MG 24 hr tablet. Sent to  [facility] [address] [place], [place] [address] Phone:[phone]

## 2022-11-24 ENCOUNTER — Other Ambulatory Visit (HOSPITAL_COMMUNITY): Payer: Self-pay

## 2022-11-24 ENCOUNTER — Telehealth: Payer: Self-pay

## 2022-11-24 NOTE — Telephone Encounter (Signed)
Pharmacy Patient Advocate Encounter  Received notification from [employer] that Prior Authorization for cloBAZam 20MG  tablets has been APPROVED from 11/24/2022 to 11/24/2023. Ran test claim, Copay is $Unable to give the copay due to Refill Too Soon rejection. Last filled a 90ds on 11/24/2022.Marland Kitchen  PA #/Case ID/Reference #: PA Case ID: [id number]

## 2022-11-24 NOTE — Telephone Encounter (Signed)
Pharmacy Patient Advocate Encounter   Received notification from CoverMyMeds that prior authorization for cloBAZam 20MG  tablets is required/requested.   Insurance verification completed.   The patient is insured through [employer] .   Per test claim: PA submitted to [employer] via CoverMyMeds Key/confirmation #/EOC [id number] Status is pending

## 2022-11-26 ENCOUNTER — Other Ambulatory Visit (HOSPITAL_BASED_OUTPATIENT_CLINIC_OR_DEPARTMENT_OTHER): Payer: Self-pay

## 2022-11-26 ENCOUNTER — Other Ambulatory Visit: Payer: Self-pay

## 2022-12-31 ENCOUNTER — Other Ambulatory Visit (HOSPITAL_BASED_OUTPATIENT_CLINIC_OR_DEPARTMENT_OTHER): Payer: Self-pay

## 2023-01-04 ENCOUNTER — Other Ambulatory Visit: Payer: Self-pay

## 2023-01-04 ENCOUNTER — Other Ambulatory Visit (HOSPITAL_BASED_OUTPATIENT_CLINIC_OR_DEPARTMENT_OTHER): Payer: Self-pay

## 2023-01-29 ENCOUNTER — Other Ambulatory Visit (HOSPITAL_BASED_OUTPATIENT_CLINIC_OR_DEPARTMENT_OTHER): Payer: Self-pay

## 2023-02-02 ENCOUNTER — Other Ambulatory Visit (HOSPITAL_BASED_OUTPATIENT_CLINIC_OR_DEPARTMENT_OTHER): Payer: Self-pay

## 2023-02-10 ENCOUNTER — Encounter: Payer: Self-pay | Admitting: Neurology

## 2023-02-10 ENCOUNTER — Ambulatory Visit: Payer: Managed Care, Other (non HMO) | Admitting: Neurology

## 2023-02-10 VITALS — BP 123/77 | HR 92 | Ht 73.0 in | Wt 144.0 lb

## 2023-02-10 DIAGNOSIS — Z79899 Other long term (current) drug therapy: Secondary | ICD-10-CM

## 2023-02-10 DIAGNOSIS — G40802 Other epilepsy, not intractable, without status epilepticus: Secondary | ICD-10-CM

## 2023-02-10 DIAGNOSIS — Z9889 Other specified postprocedural states: Secondary | ICD-10-CM

## 2023-02-10 DIAGNOSIS — F0630 Mood disorder due to known physiological condition, unspecified: Secondary | ICD-10-CM | POA: Diagnosis not present

## 2023-02-10 DIAGNOSIS — F54 Psychological and behavioral factors associated with disorders or diseases classified elsewhere: Secondary | ICD-10-CM

## 2023-02-10 MED ORDER — OXCARBAZEPINE 600 MG PO TABS: 900.0000 mg | ORAL_TABLET | Freq: Every day | ORAL | 3 refills | Status: DC

## 2023-02-10 MED ORDER — DIVALPROEX SODIUM ER 500 MG PO TB24: 1000.0000 mg | ORAL_TABLET | Freq: Every evening | ORAL | 3 refills | Status: DC

## 2023-02-10 MED ORDER — DIVALPROEX SODIUM ER 250 MG PO TB24: 250.0000 mg | ORAL_TABLET | Freq: Every evening | ORAL | 3 refills | Status: AC

## 2023-02-10 MED ORDER — CLOBAZAM 20 MG PO TABS: 20.0000 mg | ORAL_TABLET | Freq: Every evening | ORAL | 3 refills | Status: DC

## 2023-02-10 NOTE — Patient Instructions (Signed)
Clobazam Tablets What is this medication? CLOBAZAM [facility] zam) prevents and controls seizures in people with epilepsy. It works by calming overactive nerves in your body. It belongs to a group of medications called benzodiazepines. This medicine may be used for other purposes; ask your health care provider or pharmacist if you have questions. COMMON BRAND NAME(S): ONFI What should I tell my care team before I take this medication? They need to know if you have any of these conditions: History of substance use disorder Kidney disease Liver disease Lung or breathing disease Mental health conditions Suicidal thoughts, plans, or attempt An unusual or allergic reaction to clobazam, other medications, foods, dyes, or preservatives Pregnant or trying to get pregnant Breastfeeding How should I use this medication? Take this medication by mouth with water. Take it as directed on the prescription label. The tablets may be swallowed whole, split in half along the score line, or crushed and mixed in applesauce. You can take it with or without food. If it upsets your stomach, take it with food. Keep taking it unless your care team tells you to stop. A special MedGuide will be given to you by the pharmacist with each prescription and refill. Be sure to read this information carefully each time. Talk to your care team about the use of this medication in children. While it may be prescribed for children as young as 2 years for selected conditions, precautions do apply. People 65 years and older may have a stronger reaction and need a smaller dose. Overdosage: If you think you have taken too much of this medicine contact a poison control center or emergency room at once. NOTE: This medicine is only for you. Do not share this medicine with others. What if I miss a dose? If you miss a dose, take it as soon as you can. If it is almost time for your next dose, take only that dose. Do not take double or extra  doses. What may interact with this medication? Do not take this medication with any of the following: Opioids for cough Sodium oxybate Thioridazine This medication may interact with the following: Alcohol Certain antihistamines Certain medications for depression, such as amitriptyline or trazodone Certain other medications for seizures, such as phenobarbital or primidone Estrogen and progestin hormones Medications that cause drowsiness before a procedure, such as propofol Medications that help you fall asleep Medications that relax muscles Opioids for pain Other benzodiazepines, such as alprazolam, diazepam, lorazepam Phenothiazines, such as chlorpromazine or prochlorperazine This medication may affect how other medications work, and other medications may affect the way this medication works. Talk with your care team about all of the medications you take. They may suggest changes to your treatment plan to lower the risk of side effects and to make sure your medications work as intended. This list may not describe all possible interactions. Give your health care provider a list of all the medicines, herbs, non-prescription drugs, or dietary supplements you use. Also tell them if you smoke, drink alcohol, or use illegal drugs. Some items may interact with your medicine. What should I watch for while using this medication? Visit your care team for regular checks on your progress. Tell your care team if your symptoms do not start to get better or if they get worse. Do not suddenly stop taking this medication. You may develop a severe reaction. Your care team will tell you how much medication to take. If your care team wants you to stop the medication, the  dose may be slowly lowered over time to avoid any side effects. Taking this medication with other substances that cause drowsiness, such as alcohol, opioids, or other benzodiazepines can cause serious side effects. Give your care team a list of  all medications you use. They will tell you how much medication to take. Do not take more medication than directed. Call emergency services if you have problems breathing or staying awake. Long term use of this medication may cause your brain and body to depend on it. This can happen even when used as directed by your care team. You and your care team will work together to determine how long you will need to take this medication. If your care team wants you to stop this medication, the dose will be slowly lowered over time to reduce the risk of side effects. This medication may affect your coordination, reaction time, or judgment. Do not drive or operate machinery until you know how this medication affects you. Sit up or stand slowly to reduce the risk of dizzy or fainting spells. Drinking alcohol with this medication can increase the risk of these side effects. Wear a medical ID bracelet or chain. Carry a card that describes your condition. List the medications and doses you take on the card. This medication may cause serious skin reactions. They can happen weeks to months after starting the medication. Contact your care team right away if you notice fevers or flu-like symptoms with a rash. The rash may be red or purple and then turn into blisters or peeling of the skin. You may also notice a red rash with swelling of the face, lips, or lymph nodes in your neck or under your arms. This medication may cause thoughts of suicide or depression. This includes sudden changes in mood, behaviors, or thoughts. The use of this medication may increase the chance of suicidal thoughts or actions. Report worsening of mood or thoughts of suicide or dying to your care team right away. Estrogen and progestin hormones may not work as well while you are taking this medication. Your care team can help you find the contraceptive option that works for you. Talk to your care team if you may be pregnant. Prolonged use of this  medication during pregnancy can cause withdrawal in a newborn. Talk to your care team before breastfeeding. Changes to your treatment plan may be needed. If you breastfeed while taking this medication, seek medical care right away if you notice the child has slow or noisy breathing, is unusually sleepy or not able to wake up, or is limp. What side effects may I notice from receiving this medication? Side effects that you should report to your care team as soon as possible: Allergic reactions--skin rash, itching, hives, swelling of the face, lips, tongue, or throat CNS depression--slow or shallow breathing, shortness of breath, feeling faint, dizziness, confusion, trouble staying awake Rash, fever, and swollen lymph nodes Redness, blistering, peeling, or loosening of the skin, including inside the mouth Thoughts of suicide or self-harm, worsening mood, feelings of depression Side effects that usually do not require medical attention (report these to your care team if they continue or are bothersome): Constipation Drowsiness Excessive drooling Fever Unusual weakness or fatigue This list may not describe all possible side effects. Call your doctor for medical advice about side effects. You may report side effects to FDA at 1-800-FDA-1088. Where should I keep my medication? Keep out of the reach of children and pets. This medication can be abused. Keep  it in a safe place to protect it from theft. Do not share it with anyone. It is only for you. Selling or giving away this medication is dangerous and against the law. Store at room temperature between 20 and 25 degrees C (68 and 77 degrees F). Get rid of any unused medication after the expiration date. This medication may cause harm and death if it is taken by other adults, children, or pets. It is important to get rid of the medication as soon as you no longer need it or it is expired. You can do this in two ways: Take the medication to a medication  take-back program. Check with your pharmacy or law enforcement to find a location. If you cannot return the medication, check the label or package insert to see if the medication should be thrown out in the garbage or flushed down the toilet. If you are not sure, ask your care team. If it is safe to put it in the trash, take the medication out of the container. Mix the medication with cat litter, dirt, coffee grounds, or other unwanted substance. Seal the mixture in a bag or container. Put it in the trash. NOTE: This sheet is a summary. It may not cover all possible information. If you have questions about this medicine, talk to your doctor, pharmacist, or health care provider.  2024 Elsevier/Gold Standard (2022-04-02 00:00:00)
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
## 2023-02-14 NOTE — Addendum Note (Signed)
Addended by: Melvyn Novas on: 02/14/2023 06:11 PM   Modules accepted: Orders

## 2023-02-15 ENCOUNTER — Encounter: Payer: Self-pay | Admitting: Neurology

## 2023-02-15 LAB — CBC WITH DIFFERENTIAL/PLATELET
Basophils Absolute: 0 10*3/uL (ref 0.0–0.2)
Basos: 0 %
EOS (ABSOLUTE): 0 10*3/uL (ref 0.0–0.4)
Eos: 0 %
Hematocrit: 42.6 % (ref 37.5–51.0)
Hemoglobin: 15.1 g/dL (ref 13.0–17.7)
Immature Grans (Abs): 0 10*3/uL (ref 0.0–0.1)
Immature Granulocytes: 0 %
Lymphocytes Absolute: 2 10*3/uL (ref 0.7–3.1)
Lymphs: 41 %
MCH: 34.2 pg — ABNORMAL HIGH (ref 26.6–33.0)
MCHC: 35.4 g/dL (ref 31.5–35.7)
MCV: 97 fL (ref 79–97)
Monocytes Absolute: 0.6 10*3/uL (ref 0.1–0.9)
Monocytes: 12 %
Neutrophils Absolute: 2.3 10*3/uL (ref 1.4–7.0)
Neutrophils: 47 %
Platelets: 136 10*3/uL — ABNORMAL LOW (ref 150–450)
RBC: 4.41 x10E6/uL (ref 4.14–5.80)
RDW: 12.3 % (ref 11.6–15.4)
WBC: 5 10*3/uL (ref 3.4–10.8)

## 2023-02-15 LAB — CLOBAZAM
Clobazam: 122 ng/mL (ref 30–300)
Desmethylclobazam: 1827 ng/mL (ref 300–3000)

## 2023-02-15 LAB — COMPREHENSIVE METABOLIC PANEL
ALT: 21 [IU]/L (ref 0–44)
AST: 77 [IU]/L — ABNORMAL HIGH (ref 0–40)
Albumin: 4.8 g/dL (ref 4.3–5.2)
Alkaline Phosphatase: 52 [IU]/L (ref 44–121)
BUN/Creatinine Ratio: 10 (ref 9–20)
BUN: 9 mg/dL (ref 6–20)
Bilirubin Total: 0.8 mg/dL (ref 0.0–1.2)
CO2: 24 mmol/L (ref 20–29)
Calcium: 9.8 mg/dL (ref 8.7–10.2)
Chloride: 102 mmol/L (ref 96–106)
Creatinine, Ser: 0.89 mg/dL (ref 0.76–1.27)
Globulin, Total: 1.8 g/dL (ref 1.5–4.5)
Glucose: 59 mg/dL — ABNORMAL LOW (ref 70–99)
Potassium: 4 mmol/L (ref 3.5–5.2)
Sodium: 143 mmol/L (ref 134–144)
Total Protein: 6.6 g/dL (ref 6.0–8.5)
eGFR: 124 mL/min/{1.73_m2} (ref >59)

## 2023-02-15 LAB — 10-HYDROXYCARBAZEPINE: Oxcarbazepine SerPl-Mcnc: 14 ug/mL (ref 10–35)

## 2023-02-15 LAB — VALPROIC ACID LEVEL: Valproic Acid Lvl: 132 ug/mL (ref 50–100)

## 2023-06-06 ENCOUNTER — Other Ambulatory Visit: Payer: Self-pay

## 2023-06-06 ENCOUNTER — Other Ambulatory Visit: Payer: Self-pay | Admitting: Neurology

## 2023-06-06 ENCOUNTER — Other Ambulatory Visit (HOSPITAL_BASED_OUTPATIENT_CLINIC_OR_DEPARTMENT_OTHER): Payer: Self-pay

## 2023-06-06 DIAGNOSIS — G40802 Other epilepsy, not intractable, without status epilepticus: Secondary | ICD-10-CM

## 2023-06-06 MED ORDER — OXCARBAZEPINE 600 MG PO TABS
900.0000 mg | ORAL_TABLET | Freq: Every day | ORAL | 1 refills | Status: DC
  Filled 2023-06-06: qty 135, 90d supply, fill #0
  Filled 2023-09-04: qty 135, 90d supply, fill #1

## 2023-06-06 MED ORDER — DIVALPROEX SODIUM ER 500 MG PO TB24
1000.0000 mg | ORAL_TABLET | Freq: Every evening | ORAL | 1 refills | Status: DC
  Filled 2023-06-06: qty 180, 90d supply, fill #0
  Filled 2023-09-04: qty 180, 90d supply, fill #1

## 2023-06-06 NOTE — Telephone Encounter (Signed)
Last seen on 02/10/23 Follow up scheduled on 10/18/23 Clobazam last filled on 02/10/23 #90 tablets (90 day supply) Rx pending to be signed

## 2023-06-08 ENCOUNTER — Other Ambulatory Visit: Payer: Self-pay | Admitting: Neurology

## 2023-06-08 ENCOUNTER — Other Ambulatory Visit (HOSPITAL_BASED_OUTPATIENT_CLINIC_OR_DEPARTMENT_OTHER): Payer: Self-pay

## 2023-06-08 DIAGNOSIS — G40802 Other epilepsy, not intractable, without status epilepticus: Secondary | ICD-10-CM

## 2023-06-08 MED ORDER — CLOBAZAM 20 MG PO TABS: 20.0000 mg | ORAL_TABLET | Freq: Every evening | ORAL | 0 refills | Status: DC

## 2023-06-08 NOTE — Telephone Encounter (Signed)
 Last seen on 02/10/23 Follow up scheduled on 10/18/23 Last filled on 02/10/23 #90 tablets  Rx pending to be signed

## 2023-06-08 NOTE — Telephone Encounter (Signed)
 Pt's grandmother reports that [facility] is telling her that they have not received a Rx for the cloBAZam  (ONFI ) 20 MG tablet .  Her concern is that pt only has enough until Monday.  She is asking all his medications go thru [facility] , is more cost effective.

## 2023-06-09 ENCOUNTER — Other Ambulatory Visit (HOSPITAL_BASED_OUTPATIENT_CLINIC_OR_DEPARTMENT_OTHER): Payer: Self-pay

## 2023-09-04 ENCOUNTER — Other Ambulatory Visit: Payer: Self-pay | Admitting: Neurology

## 2023-09-04 DIAGNOSIS — G40802 Other epilepsy, not intractable, without status epilepticus: Secondary | ICD-10-CM

## 2023-09-06 ENCOUNTER — Other Ambulatory Visit (HOSPITAL_BASED_OUTPATIENT_CLINIC_OR_DEPARTMENT_OTHER): Payer: Self-pay

## 2023-09-07 ENCOUNTER — Other Ambulatory Visit (HOSPITAL_BASED_OUTPATIENT_CLINIC_OR_DEPARTMENT_OTHER): Payer: Self-pay

## 2023-09-07 MED ORDER — CLOBAZAM 20 MG PO TABS
20.0000 mg | ORAL_TABLET | Freq: Every evening | ORAL | 3 refills | Status: DC
  Filled 2023-09-07: qty 90, 90d supply, fill #0
  Filled 2023-11-29: qty 90, 90d supply, fill #1

## 2023-09-07 NOTE — Telephone Encounter (Signed)
 Spoke w/Pt mother to make her aware the refill request was sent from pharmacy and has been sent to be signed for refill.

## 2023-09-07 NOTE — Telephone Encounter (Signed)
 Pt's mother called stating that the pt is needing his cloBAZam  (ONFI ) 20 MG tablet filled and that he only has enough to last him till Sat. Please advise.

## 2023-09-08 ENCOUNTER — Other Ambulatory Visit: Payer: Self-pay

## 2023-09-09 ENCOUNTER — Other Ambulatory Visit (HOSPITAL_BASED_OUTPATIENT_CLINIC_OR_DEPARTMENT_OTHER): Payer: Self-pay

## 2023-09-17 ENCOUNTER — Other Ambulatory Visit (HOSPITAL_BASED_OUTPATIENT_CLINIC_OR_DEPARTMENT_OTHER): Payer: Self-pay

## 2023-09-17 MED ORDER — LOPERAMIDE HCL 2 MG PO TABS
ORAL_TABLET | ORAL | 0 refills | Status: AC
  Filled 2023-09-17: qty 24, 6d supply, fill #0

## 2023-10-18 ENCOUNTER — Telehealth: Payer: Managed Care, Other (non HMO) | Admitting: Adult Health

## 2023-10-18 DIAGNOSIS — G40802 Other epilepsy, not intractable, without status epilepticus: Secondary | ICD-10-CM | POA: Diagnosis not present

## 2023-10-18 NOTE — Progress Notes (Signed)
 PATIENT: Connor Woods DOB: Sep 28, 2000  REASON FOR VISIT: follow up HISTORY FROM: patient  Virtual Visit via Video Note  I connected with Connor Woods on 10/18/23 at  2:45 PM EDT by a video enabled telemedicine application located remotely at Home office and verified that I am speaking with the correct person using two identifiers who was located at their own home in [place]   I discussed the limitations of evaluation and management by telemedicine and the availability of in person appointments. The patient expressed understanding and agreed to proceed.   PATIENT: Connor Woods DOB: [birth year]  REASON FOR VISIT: follow up HISTORY FROM: patient  HISTORY OF PRESENT ILLNESS: Today 10/18/23  Connor Woods is a 23 y.o. male with a history of seizures. Returns today for follow-up.  Patient reports overall he has been doing well.  No seizure events.  Last seizure was in 2012. he remains on Trileptal , Onfi  and Depakote .  Per Dr. Jetta Morrow note she was going to do a EEG as the patient was having auras impending doom sensation at nighttime.  It does not appear EEG has been completed.  Patient was not aware that an EEG was needed.  Patient's last Depakote  level was also elevated.  New order was placed for recheck however this has not been done.  He returns today for an evaluation.   HISTORY (Copied from Dr.Dohmeier's note)  Connor Woods is a 23 y.o. 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   Frontal lobe epilepsy.  He changed his neurology aftercare to [facility] in 2022.  He is on high dose Valproic acid  and Clobezam and Trileptal .    Prior Authorization for cloBAZam  20MG  tablets has been APPROVED from 11/24/2022 to 11/24/2023    Chief concern according to patient :  I would like to get rid of my auras, but glad to not have seizures. At night I have auras and rapid heart beats, getting  scared, trembling - its a sense of doom - something bad is going to happen and :  I can pull myself out of it with regular deep breathing.  It hasn't let to a seizure in many years. He reports no executive function delay since surgery, no weakness.        04/12/22   Connor Woods is a 23 y.o. male who has been followed in this office for seizures. Returns today for follow-up.  He is currently on Depakote  but unsure of dose.  He also takes Onfi  20 mg daily.  He has not had any seizure events.  Operates a motor vehicle without difficulty.  Able to complete all ADLs independently. Reports that he does not feel that zoloft  is working well.  His PCP originally prescribed this.  He denies any thoughts of harming himself or others.  Does not feel that worsening mood at current with Onfi .  Returns today for evaluation.   HISTORY HPI:  Connor Woods is a 23 y.o. 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  No further seizure activity since 6 month ago- time for a refill of depakote , clobazam  and oxcarbazepine .  Needs some baseline labs today, but reports that Dr. Schuyler Custard has just done that - I have no access by [facility] and he didn't bring his smart phone or print out. - CMET, CBC and  LFT. Is now fully vaccinated for Covid 19.  REVIEW OF SYSTEMS: Out of a complete 14 system review of symptoms, the patient complains only of the following symptoms, and all other reviewed systems are negative.  ALLERGIES: No Known Allergies  HOME MEDICATIONS: Outpatient Medications Prior to Visit  Medication Sig Dispense Refill   cloBAZam  (ONFI ) 20 MG tablet Take 1 tablet (20 mg total) by mouth every evening. 90 tablet 3   cloBAZam  (ONFI ) 20 MG tablet Take 1 tablet (20 mg total) by mouth every evening. 90 tablet 3   divalproex  (DEPAKOTE  ER) 250 MG 24 hr tablet Take 1 tablet (250 mg total) by mouth every evening. 90 tablet 3   divalproex  (DEPAKOTE  ER) 500 MG 24 hr tablet Take 2  tablets (1,000 mg total) by mouth every evening. 180 tablet 1   loperamide  (IMODIUM  A-D) 2 MG tablet Take 1 tablet (2 mg total) by mouth 4 (four) times a day if needed for diarrhea. 24 tablet 0   oxcarbazepine  (TRILEPTAL ) 600 MG tablet Take 1.5 tablets (900 mg total) by mouth daily. 135 tablet 1   No facility-administered medications prior to visit.    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 and radial head dislocation   TONSILLECTOMY AND ADENOIDECTOMY     UMBILICAL HERNIA REPAIR  12/26/2008    FAMILY HISTORY: Family History  Problem Relation Age of Onset   Kidney disease Paternal Aunt    Cancer Paternal Uncle    Lung cancer Maternal Grandfather    Hypertension Paternal Grandmother    Epilepsy Neg Hx     SOCIAL HISTORY: Social History   Socioeconomic History   Marital status: Single    Spouse name: Not on file   Number of children: Not on file   Years of education: Not on file   Highest education level: Not on file  Occupational History   Not on file  Tobacco Use   Smoking status: Never   Smokeless tobacco: Never  Vaping Use   Vaping status: Former  Substance and Sexual Activity   Alcohol use: Yes    Alcohol/week: 2.0 standard drinks of alcohol    Types: 1 Glasses of wine, 1 Cans of beer per week   Drug use: No   Sexual activity: Never  Other Topics Concern   Not on file  Social History Narrative   Not on file   Social Drivers of Health   Financial Resource Strain: Not on file  Food Insecurity: Not on file  Transportation Needs: Not on file  Physical Activity: Not on file  Stress: Not on file  Social Connections: Unknown (09/14/2021)   Received from  [facility]   Social Network    Social Network: Not on file  Intimate Partner Violence: Unknown (08/06/2021)   Received from [facility]   HITS    Physically Hurt: Not on file    Insult or Talk Down To: Not on file    Threaten Physical Harm: Not on file    Scream or Curse: Not on file      PHYSICAL EXAM Generalized: Well developed, in no acute distress  Neurological examination  Mentation: Alert oriented to time, place, history taking. Follows all commands speech and language fluent Cranial nerve II-XII: Facial symmetry noted.   DIAGNOSTIC DATA (LABS, IMAGING, TESTING) - I reviewed patient records, labs, notes, testing and imaging myself where available.  Lab Results  Component Value Date   WBC 5.0 02/10/2023   HGB 15.1 02/10/2023   HCT 42.6 02/10/2023   MCV 97 02/10/2023   PLT 136 (L) 02/10/2023      Component Value Date/Time   NA 143 02/10/2023 1356   K 4.0 02/10/2023 1356   CL 102 02/10/2023 1356   CO2 24 02/10/2023 1356   GLUCOSE 59 (L) 02/10/2023 1356   GLUCOSE 99 01/25/2022 2149   BUN 9 02/10/2023 1356   CREATININE 0.89 02/10/2023 1356   CALCIUM 9.8 02/10/2023 1356   PROT 6.6 02/10/2023 1356   ALBUMIN 4.8 02/10/2023 1356   AST 77 (H) 02/10/2023 1356   ALT 21 02/10/2023 1356   ALKPHOS 52 02/10/2023 1356   BILITOT 0.8 02/10/2023 1356   GFRNONAA >60 01/25/2022 2149   GFRAA >60 10/05/2019 0910   Lab Results  Component Value Date   CHOL 148 03/19/2013   HDL 40 03/19/2013   LDLCALC 81 03/19/2013   TRIG 137 03/19/2013   CHOLHDL 3.7 03/19/2013   Lab Results  Component Value Date   HGBA1C 4.8 03/19/2013   No results found for: [id number] Lab Results  Component Value Date   TSH 6.865 (H) 03/19/2013      ASSESSMENT AND PLAN 23 y.o. year old male  has a past medical history of Asthma, Benign neoplasm (08/2013), Epilepsy (HCC), and Mood swings. here with:  Seizures  - Continue Depakote  1250 daily - Continue Onfi  20 mg at bedtime - Continue  Trileptal  900 mg daily - EEG ordered -Advise that when he comes in for his EEG recheck Depakote  level. - Follow-up in 6 months or sooner if needed  Clem Currier, MSN, NP-C 10/18/2023, 2:42 PM [facility] [address] [place], [place] [address]  The patient's condition requires frequent monitoring and adjustments in the treatment plan, reflecting the ongoing complexity of care.  This provider is the continuing focal point for all needed services for this condition.  [phone]

## 2023-10-18 NOTE — Patient Instructions (Signed)
-   Continue Depakote  1250 daily - Continue Onfi  20 mg at bedtime - Continue Trileptal  900 mg daily - EEG ordered-when you come in for EEG please have blood work to recheck Depakote  level

## 2023-11-29 ENCOUNTER — Other Ambulatory Visit: Payer: Self-pay | Admitting: Neurology

## 2023-11-30 ENCOUNTER — Other Ambulatory Visit (HOSPITAL_COMMUNITY): Payer: Self-pay

## 2023-11-30 ENCOUNTER — Other Ambulatory Visit (HOSPITAL_BASED_OUTPATIENT_CLINIC_OR_DEPARTMENT_OTHER): Payer: Self-pay

## 2023-11-30 ENCOUNTER — Telehealth: Payer: Self-pay

## 2023-11-30 NOTE — Telephone Encounter (Signed)
 Pharmacy Patient Advocate Encounter  Received notification from [employer] that Prior Authorization for cloBAZam  20MG  tablets has been APPROVED from 7.30.25 to 7.30.26. Ran test claim,  This test claim was processed through [facility]- copay amounts may vary at other pharmacies due to pharmacy/plan contracts, or as the patient moves through the different stages of their insurance plan.   PA #/Case ID/Reference #: (Key: [id number]

## 2023-12-01 ENCOUNTER — Other Ambulatory Visit (HOSPITAL_BASED_OUTPATIENT_CLINIC_OR_DEPARTMENT_OTHER): Payer: Self-pay

## 2023-12-02 ENCOUNTER — Other Ambulatory Visit (HOSPITAL_BASED_OUTPATIENT_CLINIC_OR_DEPARTMENT_OTHER): Payer: Self-pay

## 2023-12-02 MED ORDER — OXCARBAZEPINE 600 MG PO TABS
900.0000 mg | ORAL_TABLET | Freq: Every day | ORAL | 1 refills | Status: AC
  Filled 2023-12-02: qty 135, 90d supply, fill #0
  Filled 2024-03-09: qty 135, 90d supply, fill #1

## 2023-12-02 MED ORDER — DIVALPROEX SODIUM ER 500 MG PO TB24
1000.0000 mg | ORAL_TABLET | Freq: Every evening | ORAL | 1 refills | Status: AC
  Filled 2023-12-02: qty 180, 90d supply, fill #0
  Filled 2024-03-09: qty 180, 90d supply, fill #1

## 2023-12-05 ENCOUNTER — Other Ambulatory Visit (HOSPITAL_BASED_OUTPATIENT_CLINIC_OR_DEPARTMENT_OTHER): Payer: Self-pay

## 2024-02-19 ENCOUNTER — Encounter: Payer: Self-pay | Admitting: Neurology

## 2024-03-09 ENCOUNTER — Other Ambulatory Visit: Payer: Self-pay | Admitting: Neurology

## 2024-03-09 ENCOUNTER — Telehealth: Payer: Self-pay | Admitting: Adult Health

## 2024-03-09 ENCOUNTER — Telehealth: Payer: Self-pay

## 2024-03-09 ENCOUNTER — Other Ambulatory Visit (HOSPITAL_COMMUNITY): Payer: Self-pay

## 2024-03-09 DIAGNOSIS — G40802 Other epilepsy, not intractable, without status epilepticus: Secondary | ICD-10-CM

## 2024-03-09 NOTE — Telephone Encounter (Signed)
 Pharmacy Patient Advocate Encounter   Received notification from Physician's Office that prior authorization for Clobazam  20mg  Tablet Per test claim: The current 90 day co-pay is, $102.32.  No PA needed at this time. This test claim was processed through [facility]- copay amounts may vary at other pharmacies due to pharmacy/plan contracts, or as the patient moves through the different stages of their insurance plan.

## 2024-03-09 NOTE — Telephone Encounter (Signed)
 Pt's mother reports that a PA is needed on cloBAZam  (ONFI ) 20 MG tablet

## 2024-03-12 ENCOUNTER — Other Ambulatory Visit (HOSPITAL_BASED_OUTPATIENT_CLINIC_OR_DEPARTMENT_OTHER): Payer: Self-pay

## 2024-03-12 MED ORDER — CLOBAZAM 20 MG PO TABS
20.0000 mg | ORAL_TABLET | Freq: Every evening | ORAL | 3 refills | Status: AC
  Filled 2024-03-12: qty 90, 90d supply, fill #0
  Filled 2024-06-06: qty 90, 90d supply, fill #1

## 2024-03-13 ENCOUNTER — Other Ambulatory Visit (HOSPITAL_BASED_OUTPATIENT_CLINIC_OR_DEPARTMENT_OTHER): Payer: Self-pay

## 2024-03-13 ENCOUNTER — Other Ambulatory Visit: Payer: Self-pay

## 2024-04-04 ENCOUNTER — Other Ambulatory Visit: Payer: Self-pay

## 2024-04-04 ENCOUNTER — Emergency Department (HOSPITAL_BASED_OUTPATIENT_CLINIC_OR_DEPARTMENT_OTHER)
Admission: EM | Admit: 2024-04-04 | Discharge: 2024-04-04 | Disposition: A | Discharge status: Home / Self Care | Attending: Emergency Medicine | Admitting: Emergency Medicine

## 2024-04-04 ENCOUNTER — Other Ambulatory Visit (HOSPITAL_BASED_OUTPATIENT_CLINIC_OR_DEPARTMENT_OTHER): Payer: Self-pay

## 2024-04-04 ENCOUNTER — Emergency Department (HOSPITAL_BASED_OUTPATIENT_CLINIC_OR_DEPARTMENT_OTHER): Admitting: Radiology

## 2024-04-04 DIAGNOSIS — R0789 Other chest pain: Secondary | ICD-10-CM

## 2024-04-04 DIAGNOSIS — S2241XA Multiple fractures of ribs, right side, initial encounter for closed fracture: Secondary | ICD-10-CM

## 2024-04-04 MED ORDER — CYCLOBENZAPRINE HCL 5 MG PO TABS
5.0000 mg | ORAL_TABLET | Freq: Once | ORAL | Status: AC
  Administered 2024-04-04: 5 mg via ORAL
  Filled 2024-04-04: qty 1

## 2024-04-04 MED ORDER — ACETAMINOPHEN 500 MG PO TABS
1000.0000 mg | ORAL_TABLET | Freq: Once | ORAL | Status: AC
  Administered 2024-04-04: 1000 mg via ORAL
  Filled 2024-04-04: qty 2

## 2024-04-04 MED ORDER — CYCLOBENZAPRINE HCL 10 MG PO TABS
10.0000 mg | ORAL_TABLET | Freq: Two times a day (BID) | ORAL | 0 refills | Status: AC | PRN
  Filled 2024-04-04: qty 20, 10d supply, fill #0

## 2024-04-04 MED ORDER — OXYCODONE HCL 5 MG PO TABS
5.0000 mg | ORAL_TABLET | Freq: Once | ORAL | Status: AC
  Administered 2024-04-04: 5 mg via ORAL
  Filled 2024-04-04: qty 1

## 2024-04-04 MED ORDER — OXYCODONE HCL 5 MG PO TABS
5.0000 mg | ORAL_TABLET | Freq: Three times a day (TID) | ORAL | 0 refills | Status: AC | PRN
  Filled 2024-04-04: qty 15, 5d supply, fill #0

## 2024-04-04 MED ORDER — KETOROLAC TROMETHAMINE 15 MG/ML IJ SOLN
15.0000 mg | Freq: Once | INTRAMUSCULAR | Status: AC
  Administered 2024-04-04: 15 mg via INTRAMUSCULAR
  Filled 2024-04-04: qty 1

## 2024-04-04 MED ORDER — LIDOCAINE 5 % EX PTCH
1.0000 | MEDICATED_PATCH | Freq: Once | CUTANEOUS | Status: DC
  Administered 2024-04-04: 1 via TRANSDERMAL
  Filled 2024-04-04: qty 1

## 2024-04-04 NOTE — Discharge Instructions (Addendum)
 [facility]  Thank you for allowing us  to take care of you today.  You came to the Emergency Department today because you had pain in your left side after a right side after a fall yesterday.  Here in the emergency department your symptoms and characteristics of your fall but you at low risk for having severe trauma in your head or neck, therefore we did not get imaging of these areas.  We did get a chest x-ray.  We are not seeing any broken ribs that are out of place.  It is possible that you could have 2 broken ribs that are not out of place.  We are not seeing any other abnormalities like air or fluid around your lung that should not be there.  There is no specific treatment for rib fractures that are not out of place did there were no complication such as air or fluid around the lung.  You can use multimodal pain control to treat your pain at home.  You can use Tylenol  and ibuprofen  every 4-6 hours as needed.  You can also use over-the-counter lidocaine patches, ice, heat.  We will also prescribe a short course of oxycodone, and opioid painkiller, for breakthrough pain.  You can use this up to every 8 hours as needed, as well as Flexeril, a muscle relaxer that you can use twice a day as needed.  Please do not drive if you are using the oxycodone or Flexeril as they can make you sleepy.  To-Do: 1. Please follow-up with your primary doctor within 1 - 2 weeks / as soon as possible.   Please return to the Emergency Department or call 911 if you experience have worsening of your symptoms, or do not get better, chest pain, shortness of breath, severe or significantly worsening pain, high fever, severe confusion, pass out or have any reason to think that you need emergency medical care.   We hope you feel better soon.   Mitzie Later, MD Department of Emergency Medicine [facility] [place]

## 2024-04-04 NOTE — ED Triage Notes (Signed)
 Reports mechanical fall yesterday and c/o R rib pain. Denies hitting head.

## 2024-04-04 NOTE — ED Provider Notes (Signed)
 [place] EMERGENCY DEPARTMENT AT [facility] Provider Note   CSN: [id number] Arrival date & time: 04/04/24  9178     History Chief Complaint  Patient presents with   Fall    HPI: Connor Woods is a 23 y.o. male with no pertinent history who presents complaining of fall. Patient arrived via POV accompanied by mother.  History provided by patient.  No interpreter required during this encounter.  Patient reports that yesterday he was climbing up a ladder to put something up in the loft, and he slipped off the ladder and fell approximately 5 feet, however caught himself on the ladder.  Did not hit his head, loss consciousness.  Denies headache, neck pain, back pain, nausea, vomiting, diarrhea, chest pain, pelvic pain, extremity pain.  Reports that he does have right lateral chest wall pain, which has been particularly painful with movement, therefore he came to the emergency department for further evaluation.  Patient denies use of anticoagulants.  Patient's recorded medical, surgical, social, medication list and allergies were reviewed in the Snapshot window as part of the initial history.   Prior to Admission medications   Medication Sig Start Date End Date Taking? Authorizing Provider  cyclobenzaprine  (FLEXERIL ) 10 MG tablet Take 1 tablet (10 mg total) by mouth 2 (two) times daily as needed for muscle spasms. 04/04/24  Yes Rogelia Jerilynn RAMAN, MD  oxyCODONE  (ROXICODONE ) 5 MG immediate release tablet Take 1 tablet (5 mg total) by mouth every 8 (eight) hours as needed for up to 5 days for severe pain (pain score 7-10) or breakthrough pain. 04/04/24 04/09/24 Yes Rogelia Jerilynn RAMAN, MD  cloBAZam  (ONFI ) 20 MG tablet Take 1 tablet (20 mg total) by mouth every evening. 03/12/24   Dohmeier, Dedra, MD  divalproex  (DEPAKOTE  ER) 250 MG 24 hr tablet Take 1 tablet (250 mg total) by mouth every evening. 02/10/23   Dohmeier, Dedra, MD  divalproex  (DEPAKOTE  ER) 500 MG 24 hr tablet Take 2 tablets  (1,000 mg total) by mouth every evening. 12/02/23   Millikan, Megan, NP  loperamide  (IMODIUM  A-D) 2 MG tablet Take 1 tablet (2 mg total) by mouth 4 (four) times a day if needed for diarrhea. 09/17/23     oxcarbazepine  (TRILEPTAL ) 600 MG tablet Take 1.5 tablets (900 mg total) by mouth daily. 12/02/23   Millikan, Megan, NP     Allergies: Patient has no known allergies.   Review of Systems   ROS as per HPI  Physical Exam Updated Vital Signs BP 123/76 (BP Location: Right Arm)   Pulse 62   Temp 98 F (36.7 C) (Oral)   Resp 17   SpO2 99%  Physical Exam Vitals and nursing note reviewed.  Constitutional:      General: He is not in acute distress.    Appearance: He is well-developed.  HENT:     Head: Normocephalic and atraumatic.  Eyes:     Conjunctiva/sclera: Conjunctivae normal.  Cardiovascular:     Rate and Rhythm: Normal rate and regular rhythm.     Heart sounds: No murmur heard. Pulmonary:     Effort: Pulmonary effort is normal. No respiratory distress.     Breath sounds: Normal breath sounds.  Abdominal:     Palpations: Abdomen is soft.     Tenderness: There is no abdominal tenderness.  Musculoskeletal:        General: No swelling.     Cervical back: Neck supple. No bony tenderness. No pain with movement.     Thoracic back:  No bony tenderness.     Lumbar back: No bony tenderness.     Comments: Tenderness to palpation of the right lateral chest wall.  As well as stable to AP and lateral compression.  Skin:    General: Skin is warm and dry.     Capillary Refill: Capillary refill takes less than 2 seconds.  Neurological:     Mental Status: He is alert.     Motor: No weakness.     Gait: Gait normal.  Psychiatric:        Mood and Affect: Mood normal.     ED Course/ Medical Decision Making/ A&P    Procedures Procedures   Medications Ordered in ED Medications  cyclobenzaprine  (FLEXERIL ) tablet 5 mg (5 mg Oral Given 04/04/24 0924)  oxyCODONE  (Oxy IR/ROXICODONE ) immediate  release tablet 5 mg (5 mg Oral Given 04/04/24 0924)  ketorolac  (TORADOL ) 15 MG/ML injection 15 mg (15 mg Intramuscular Given 04/04/24 0925)  acetaminophen  (TYLENOL ) tablet 1,000 mg (1,000 mg Oral Given 04/04/24 9075)    Medical Decision Making:   Connor Woods is a 23 y.o. male who presents for right lateral chest wall pain as per above.  Physical exam is pertinent for tenderness to palpation of right lateral chest wall without instability.   The differential includes but is not limited to rib fracture, musculoskeletal contusion, pneumothorax, hemothorax, ICH, fracture, dislocation.  Independent historian: None  External data reviewed: No pertinent external data  Labs: Not indicated  Radiology: Ordered, Independent interpretation, Details: Personally reviewed chest x-ray, do not appreciate displaced fracture, dislocation, traumatic bony derangement, pneumothorax, hemothorax, cardiomediastinal silhouette derangement, and All images reviewed independently.  Agree with radiology report at this time.   DG Ribs Unilateral W/Chest Right Result Date: 04/04/2024 EXAM: 2 VIEW(S) XRAY OF THE RIGHT RIBS AND CHEST 04/04/2024 08:38:52 AM COMPARISON: Chest radiographs 01/25/2022 and earlier. CLINICAL HISTORY: 23 year old male. Fall. FINDINGS: BONES: Marked area of clinical concern is the anterolisthesis of the right rib costochondral junction. . No displaced right rib fractures identified. But there is mild cortical irregularity at both the anterior 8th and 9th ribs ribs. 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 LUNGS AND PLEURA: No consolidation or pulmonary edema. No pleural effusion or pneumothorax. HEART AND MEDIASTINUM: No acute abnormality of the cardiac and mediastinal silhouettes. IMPRESSION: 1. No displaced right rib fracture. Nondisplaced anterior right 8th, 9th rib fracture(s) not excluded. 2. No acute cardiopulmonary abnormality. Electronically signed by: Helayne Hurst MD 04/04/2024 08:47 AM EST RP  Workstation: [id number]    EKG/Medicine tests: Not indicated EKG Interpretation:                  Interventions:Toradol , Tylenol , Flexeril , oxycodone , lidocaine  patch   See the EMR for full details regarding lab and imaging results.  Patient overall well-appearing on exam, lungs CTAB, no respiratory distress, saturating well on room air, does have tenderness to palpation of right lateral chest wall.  Chest x-ray without displaced rib fractures, pneumothorax, hemothorax, however possible nondisplaced rib fractures.  Patient is negative per [place] CT and CT C-spine rule, does not have other areas of focal pain or tenderness, therefore do not feel that patient requires additional imaging.  Will treat patient with multimodal pain control.  On reevaluation, patient with significant improvement in pain control.  Will prescribe multimodal pain therapy.  Discussed return precautions with patient and mother at bedside.  Discharged in stable condition.  Presentation is most consistent with acute complicated illness  Discussion of management or test interpretations with external provider(s):  Not indicated  Risk Drugs:OTC drugs and Prescription drug management  Disposition: DISCHARGE: I believe that the patient is safe for discharge home with outpatient follow-up. Patient was informed of all pertinent physical exam, laboratory, and imaging findings. Patient's suspected etiology of their symptom presentation was discussed with the patient and all questions were answered. We discussed following up with PCP. I provided thorough ED return precautions. The patient feels safe and comfortable with this plan.  MDM generated using voice dictation software and may contain dictation errors.  Please contact me for any clarification or with any questions.  Clinical Impression:  1. Rib pain on right side   2. Closed traumatic nondisplaced fracture of two ribs of right side, initial encounter      Discharge    Final Clinical Impression(s) / ED Diagnoses Final diagnoses:  Rib pain on right side  Closed traumatic nondisplaced fracture of two ribs of right side, initial encounter    Rx / DC Orders ED Discharge Orders          Ordered    oxyCODONE  (ROXICODONE ) 5 MG immediate release tablet  Every 8 hours PRN        04/04/24 1107    cyclobenzaprine  (FLEXERIL ) 10 MG tablet  2 times daily PRN        04/04/24 1107             Rogelia Jerilynn RAMAN, MD 04/09/24 1408

## 2024-04-11 ENCOUNTER — Telehealth: Payer: Self-pay | Admitting: Neurology

## 2024-04-11 DIAGNOSIS — Z9889 Other specified postprocedural states: Secondary | ICD-10-CM

## 2024-04-11 DIAGNOSIS — Z79899 Other long term (current) drug therapy: Secondary | ICD-10-CM

## 2024-04-11 DIAGNOSIS — G40802 Other epilepsy, not intractable, without status epilepticus: Secondary | ICD-10-CM

## 2024-04-19 ENCOUNTER — Ambulatory Visit: Admitting: Neurology

## 2024-06-06 ENCOUNTER — Other Ambulatory Visit: Payer: Self-pay | Admitting: Adult Health

## 2024-06-06 ENCOUNTER — Other Ambulatory Visit (HOSPITAL_BASED_OUTPATIENT_CLINIC_OR_DEPARTMENT_OTHER): Payer: Self-pay

## 2024-06-19 ENCOUNTER — Ambulatory Visit: Admitting: Neurology
